# Patient Record
Sex: Male | Born: 1955 | Race: White | Hispanic: No | Marital: Married | Smoking: Former smoker
Health system: Southern US, Community
[De-identification: ages and names within clinical notes are randomized; demographics above are authoritative.]

## PROBLEM LIST (undated history)

## (undated) DIAGNOSIS — K429 Umbilical hernia without obstruction or gangrene: Secondary | ICD-10-CM

## (undated) DIAGNOSIS — R7303 Prediabetes: Secondary | ICD-10-CM

## (undated) HISTORY — DX: Umbilical hernia without obstruction or gangrene: K42.9

## (undated) HISTORY — DX: Prediabetes: R73.03

---

## 2005-10-23 HISTORY — PX: LIVER BIOPSY: SHX301

## 2008-10-23 DIAGNOSIS — I10 Essential (primary) hypertension: Secondary | ICD-10-CM

## 2008-10-23 HISTORY — DX: Essential (primary) hypertension: I10

## 2012-12-24 ENCOUNTER — Encounter (HOSPITAL_COMMUNITY): Payer: Self-pay | Admitting: Emergency Medicine

## 2012-12-24 ENCOUNTER — Emergency Department (HOSPITAL_COMMUNITY)
Admission: EM | Admit: 2012-12-24 | Discharge: 2012-12-25 | Disposition: A | Discharge status: Home / Self Care | Payer: Self-pay | Attending: Emergency Medicine | Admitting: Emergency Medicine

## 2012-12-24 ENCOUNTER — Emergency Department (HOSPITAL_COMMUNITY): Payer: Self-pay

## 2012-12-24 DIAGNOSIS — S0990XA Unspecified injury of head, initial encounter: Secondary | ICD-10-CM | POA: Insufficient documentation

## 2012-12-24 DIAGNOSIS — F1010 Alcohol abuse, uncomplicated: Secondary | ICD-10-CM | POA: Insufficient documentation

## 2012-12-24 DIAGNOSIS — S02609A Fracture of mandible, unspecified, initial encounter for closed fracture: Secondary | ICD-10-CM

## 2012-12-24 DIAGNOSIS — Z79899 Other long term (current) drug therapy: Secondary | ICD-10-CM | POA: Insufficient documentation

## 2012-12-24 DIAGNOSIS — S02610A Fracture of condylar process of mandible, unspecified side, initial encounter for closed fracture: Secondary | ICD-10-CM | POA: Insufficient documentation

## 2012-12-24 MED ORDER — SODIUM CHLORIDE 0.9 % IV SOLN
Freq: Once | INTRAVENOUS | Status: AC
  Administered 2012-12-24: 23:00:00 via INTRAVENOUS
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
## 2012-12-24 NOTE — ED Notes (Signed)
Shari, PA at bedside. 
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
## 2012-12-25 LAB — CBC
MCV: 88.3 fL (ref 78.0–100.0)
Platelets: 266 10*3/uL (ref 150–400)
RBC: 4.37 MIL/uL (ref 4.22–5.81)
WBC: 16.9 10*3/uL — ABNORMAL HIGH (ref 4.0–10.5)

## 2012-12-25 LAB — BASIC METABOLIC PANEL
CO2: 23 mEq/L (ref 19–32)
Chloride: 98 mEq/L (ref 96–112)
Sodium: 133 mEq/L — ABNORMAL LOW (ref 135–145)

## 2012-12-25 LAB — ETHANOL: Alcohol, Ethyl (B): 187 mg/dL — ABNORMAL HIGH (ref 0–11)

## 2012-12-25 MED ORDER — HYDROCODONE-ACETAMINOPHEN 7.5-500 MG/15ML PO SOLN: 15.0000 mL | Freq: Four times a day (QID) | ORAL | Status: DC | PRN

## 2012-12-25 MED ORDER — SODIUM CHLORIDE 0.9 % IV SOLN
1000.0000 mL | INTRAVENOUS | Status: DC
  Administered 2012-12-25: 1000 mL via INTRAVENOUS

## 2012-12-25 MED ORDER — ONDANSETRON HCL 4 MG/2ML IJ SOLN
INTRAMUSCULAR | Status: AC
  Administered 2012-12-25: 4 mg
  Filled 2012-12-25: qty 2

## 2012-12-25 MED ORDER — HYDROMORPHONE HCL PF 1 MG/ML IJ SOLN
1.0000 mg | INTRAMUSCULAR | Status: DC | PRN
  Administered 2012-12-25 (×2): 1 mg via INTRAVENOUS
  Filled 2012-12-25 (×2): qty 1

## 2012-12-25 MED ORDER — MORPHINE SULFATE 4 MG/ML IJ SOLN
4.0000 mg | Freq: Once | INTRAMUSCULAR | Status: AC
  Administered 2012-12-25: 4 mg via INTRAVENOUS
  Filled 2012-12-25: qty 1

## 2012-12-25 MED ORDER — SODIUM CHLORIDE 0.9 % IV SOLN
1000.0000 mL | Freq: Once | INTRAVENOUS | Status: AC
  Administered 2012-12-25: 1000 mL via INTRAVENOUS
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
## 2012-12-25 NOTE — ED Notes (Signed)
Dr. Denton Lank made aware of ENT notes.
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
## 2012-12-25 NOTE — ED Notes (Signed)
ENT at the bedside
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
## 2015-10-24 DIAGNOSIS — R7303 Prediabetes: Secondary | ICD-10-CM

## 2015-10-24 HISTORY — DX: Prediabetes: R73.03

## 2016-10-23 DIAGNOSIS — Z8619 Personal history of other infectious and parasitic diseases: Secondary | ICD-10-CM

## 2016-10-23 HISTORY — DX: Personal history of other infectious and parasitic diseases: Z86.19

## 2018-12-02 ENCOUNTER — Encounter: Payer: Self-pay | Admitting: Internal Medicine

## 2018-12-02 ENCOUNTER — Ambulatory Visit: Payer: Self-pay | Admitting: Internal Medicine

## 2018-12-02 VITALS — BP 152/92 | HR 74 | Resp 12 | Ht 72.0 in | Wt 207.0 lb

## 2018-12-02 DIAGNOSIS — I10 Essential (primary) hypertension: Secondary | ICD-10-CM | POA: Insufficient documentation

## 2018-12-02 DIAGNOSIS — H18413 Arcus senilis, bilateral: Secondary | ICD-10-CM

## 2018-12-02 DIAGNOSIS — Z79899 Other long term (current) drug therapy: Secondary | ICD-10-CM

## 2018-12-02 DIAGNOSIS — K429 Umbilical hernia without obstruction or gangrene: Secondary | ICD-10-CM | POA: Insufficient documentation

## 2018-12-02 DIAGNOSIS — R7303 Prediabetes: Secondary | ICD-10-CM

## 2018-12-02 HISTORY — DX: Umbilical hernia without obstruction or gangrene: K42.9

## 2018-12-02 MED ORDER — AMLODIPINE BESYLATE 5 MG PO TABS: 5.0000 mg | ORAL_TABLET | Freq: Every day | ORAL | 11 refills | Status: DC

## 2018-12-02 MED ORDER — METOPROLOL TARTRATE 25 MG PO TABS: 25.0000 mg | ORAL_TABLET | Freq: Two times a day (BID) | ORAL | 11 refills | Status: DC

## 2018-12-02 NOTE — Progress Notes (Signed)
Subjective:    Patient ID: Carl Wright, male   DOB: [birth year], 63 y.o.   MRN: [id number]   HPI   Here to establish  1.  Essential Hypertension:  Diagnosed about 10 years ago.  Ran out of Amlodipine 5 mg and Metoprolol 25 mg twice daily about 2 weeks ago.   States bp generally runs in the 130/80 range on medication.    2.  Stress due to financial concerns.  Out of prison now for 1 month after 5 year sentence.  Wife held by [employer] for past 5 years and trying to get her out as well.  3.  Prediabetes:  Was 25-30 lb heavier when diagnosed.    4.  Umbilical hernia:  Does not cause him any problems currently        Current Meds  Medication Sig  . amLODipine (NORVASC) 5 MG tablet Take 5 mg by mouth daily.  . metoprolol tartrate (LOPRESSOR) 25 MG tablet Take 25 mg by mouth 2 (two) times daily.   No Known Allergies  Past Medical History:  Diagnosis Date  . Prediabetes 2017   weighed 25 lbs more then   History reviewed. No pertinent surgical history.   Family History  Problem Relation Age of Onset  . Pneumonia Mother   . Heart disease Father 6       MI was cause of death  . Stroke Father   . Hypertension Father   . Diabetes Father   . COPD Sister   . Hypertension Sister   . Diabetes Sister     Social History   Socioeconomic History  . Marital status: Married    Spouse name: Not on file  . Number of children: 1  . Years of education: 56  . Highest education level: High school graduate  Occupational History  . Occupation: unemployed    Comment: Previously had his own [employer]  . Financial resource strain: Very hard  . Food insecurity:    Worry: Never true    Inability: Never true  . Transportation needs:    Medical: Yes    Non-medical: Yes  Tobacco Use  . Smoking status: Never Smoker  . Smokeless tobacco: Never Used  Substance and Sexual Activity  . Alcohol use: Not Currently    Comment: History of 3 DUIs  . Drug use: Never  .  Sexual activity: Not on file  Lifestyle  . Physical activity:    Days per week: 7 days    Minutes per session: 30 min  . Stress: Very much  Relationships  . Social connections:    Talks on phone: Not on file    Gets together: Not on file    Attends religious service: Not on file    Active member of club or organization: Not on file    Attends meetings of clubs or organizations: Not on file    Relationship status: Not on file  . Intimate partner violence:    Fear of current or ex partner: Not on file    Emotionally abused: Not on file    Physically abused: Not on file    Forced sexual activity: Not on file  Other Topics Concern  . Not on file  Social History Narrative   Recently released from prison for food stamp fraud--5 year sentence for which he was released Jan 2020.    His wife is originally [place] and caught up in same situation.  ICE has detained her for  the past 5 years.  He thought she was to be released to him when he got out of prison--so he has this stress as well.   Her green card expired 2 weeks before she was arrested.   He is living with his brother and brother's wife.       Review of Systems    Objective:   BP (!) 152/92 (BP Location: Left Arm, Patient Position: Sitting, Cuff Size: Normal)   Pulse 74   Resp 12   Ht 6' (1.829 m)   Wt 207 lb (93.9 kg)   BMI 28.07 kg/m   Physical Exam  NAD HEENT:  PERRl, EOMI, bilateral arcus senilis.  Discs sharp bilaterally TMs pearly gray, throat without injection Neck:  Supple, No adenopathy, no thyromegaly Chest:  CTA CV:  RRR with normal S1 and S2, No S3, S4 or murmur.  Radial and DP pulses normal and equal Abd:  S, NT, No HSM or mass, + BS.  Opening at umbilicus through muscle wall, but no hernia sac currently LE:  No edema  Assessment & Plan   1.  Hypertension:  Restart Amlodipine 5 mg daily and Metoprolol 25 mg twice daily.   BP and pulse check in 2 weeks. CMP  2.  Prediabetes:  A1C and CMP  today  3.  Arcus senilis:  LP, nonfasting today.  4.  Stress:   Not interested in SW referral.  Has obtained food stamps and working on getting a job.

## 2018-12-03 LAB — COMPREHENSIVE METABOLIC PANEL
ALBUMIN: 4.9 g/dL (ref 3.8–4.9)
ALT: 19 IU/L (ref 0–44)
AST: 19 IU/L (ref 0–40)
Albumin/Globulin Ratio: 1.5 (ref 1.2–2.2)
Alkaline Phosphatase: 80 IU/L (ref 39–117)
BUN / CREAT RATIO: 14 (ref 10–24)
BUN: 14 mg/dL (ref 8–27)
Bilirubin Total: 0.3 mg/dL (ref 0.0–1.2)
CALCIUM: 10.4 mg/dL — AB (ref 8.6–10.2)
CO2: 24 mmol/L (ref 20–29)
CREATININE: 0.97 mg/dL (ref 0.76–1.27)
Chloride: 101 mmol/L (ref 96–106)
GFR, EST AFRICAN AMERICAN: 98 mL/min/{1.73_m2} (ref >59)
GFR, EST NON AFRICAN AMERICAN: 84 mL/min/{1.73_m2} (ref >59)
GLOBULIN, TOTAL: 3.3 g/dL (ref 1.5–4.5)
Glucose: 95 mg/dL (ref 65–99)
Potassium: 5.1 mmol/L (ref 3.5–5.2)
SODIUM: 140 mmol/L (ref 134–144)
TOTAL PROTEIN: 8.2 g/dL (ref 6.0–8.5)

## 2018-12-03 LAB — HGB A1C W/O EAG: Hgb A1c MFr Bld: 5.7 % — ABNORMAL HIGH (ref 4.8–5.6)

## 2018-12-03 LAB — LIPID PANEL W/O CHOL/HDL RATIO
CHOLESTEROL TOTAL: 155 mg/dL (ref 100–199)
HDL: 55 mg/dL (ref >39)
LDL CALC: 85 mg/dL (ref 0–99)
Triglycerides: 76 mg/dL (ref 0–149)
VLDL CHOLESTEROL CAL: 15 mg/dL (ref 5–40)

## 2018-12-04 ENCOUNTER — Telehealth: Payer: Self-pay | Admitting: Internal Medicine

## 2018-12-04 NOTE — Telephone Encounter (Signed)
Social Work Intern Astronomer called Carl Wright to welcome him to [facility] and to inform him about mental health counseling that is also available if interested.

## 2018-12-12 ENCOUNTER — Ambulatory Visit: Payer: Self-pay

## 2018-12-12 VITALS — BP 138/88 | HR 70

## 2018-12-12 DIAGNOSIS — I10 Essential (primary) hypertension: Secondary | ICD-10-CM

## 2018-12-12 MED ORDER — METOPROLOL TARTRATE 25 MG PO TABS: 25.0000 mg | ORAL_TABLET | Freq: Two times a day (BID) | ORAL | 3 refills | Status: DC

## 2018-12-12 MED ORDER — AMLODIPINE BESYLATE 5 MG PO TABS: 5.0000 mg | ORAL_TABLET | Freq: Every day | ORAL | 3 refills | Status: DC

## 2018-12-12 NOTE — Progress Notes (Signed)
Patient BP better then when he came in for initial visit. Patient informed to continue current dose of medication. Patient has lab appointment scheduled for 2 months. Will recheck BP at that time. Patient verbalized understanding

## 2019-02-12 ENCOUNTER — Other Ambulatory Visit: Payer: Self-pay

## 2019-03-19 ENCOUNTER — Other Ambulatory Visit: Payer: Self-pay

## 2019-04-08 ENCOUNTER — Ambulatory Visit: Payer: Self-pay | Admitting: Internal Medicine

## 2019-12-07 ENCOUNTER — Other Ambulatory Visit: Payer: Self-pay | Admitting: Internal Medicine

## 2019-12-08 ENCOUNTER — Other Ambulatory Visit: Payer: Self-pay | Admitting: Internal Medicine

## 2019-12-17 ENCOUNTER — Encounter: Payer: Self-pay | Admitting: Internal Medicine

## 2019-12-17 ENCOUNTER — Other Ambulatory Visit: Payer: Self-pay

## 2019-12-17 ENCOUNTER — Ambulatory Visit: Payer: Self-pay | Admitting: Internal Medicine

## 2019-12-17 VITALS — BP 168/90 | HR 76 | Temp 99.8°F | Resp 14 | Ht 72.0 in | Wt 212.0 lb

## 2019-12-17 DIAGNOSIS — N529 Male erectile dysfunction, unspecified: Secondary | ICD-10-CM

## 2019-12-17 DIAGNOSIS — G609 Hereditary and idiopathic neuropathy, unspecified: Secondary | ICD-10-CM

## 2019-12-17 DIAGNOSIS — I10 Essential (primary) hypertension: Secondary | ICD-10-CM

## 2019-12-17 DIAGNOSIS — R7303 Prediabetes: Secondary | ICD-10-CM

## 2019-12-17 MED ORDER — AMLODIPINE BESYLATE 10 MG PO TABS: ORAL_TABLET | ORAL | 3 refills | Status: DC

## 2019-12-17 NOTE — Patient Instructions (Signed)
Drink a glass of water before every meal Drink 6-8 glasses of water daily Eat three meals daily Eat a protein and healthy fat with every meal (eggs,fish, chicken, [place] and limit red meats) Eat 5 servings of vegetables daily, mix the colors Eat 2 servings of fruit daily with skin, if skin is edible Use smaller plates Put food/utensils down as you chew and swallow each bite Eat at a table with friends/family at least once daily, no TV Do not eat in front of the TV  Recent studies show that people who consume all of their calories in a 12 hour period lose weight more efficiently.  For example, if you eat your first meal at 7:00 a.m., your last meal of the day should be completed by 7:00 p.m.  Your first goal is to give up all sweet drinks for 14 days--and then continue to do so.  Wean Metoprolol as written on med list.

## 2019-12-17 NOTE — Progress Notes (Signed)
    Subjective:    Patient ID: Carl Wright, male   DOB: 1956/02/10, 64 y.o.   MRN: [id number]   HPI  Has not been seen in over 1 year.   1.  Essential Hypertension:  Never misses his amlodipine or metoprolol.    2.  Erectile dysfunction:  Erection only lasts about 3 minutes and not able to reach orgasm.  When he self stimulates, his erection is better, but still short lived and able to reach orgasm.  His wife has been with him since December after being in [employer] custody for 6 years.  He has been on Lisinopril in the past with side effects, including what he feels was ED due to the medication.  When off all meds, no ED.  His BP when incarcerated in 2015 was too high and so was placed on amlodipine, Metoprolol, HCTZ.  He took himself off HCTZ as was running to the bathroom all the time.  Actually stopped all 3 meds.  Back on Amlodipine and Metoprolol thereafter and ED returned.   He does have some stress.  3.  Burning in feet:  Started when in prison 2 years ago.  States from wearing bad shoes on hard floors.   No polydipsia or polyuria. 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 No symptoms anywhere else.  No low back pain.   Father had diabetes.  His mother was obese and he is not aware of what health issues she may have had--she died at 42.  Father died at 50 of an MI.  Current Meds  Medication Sig  . amLODipine (NORVASC) 5 MG tablet Take 1 tablet by mouth once daily  . metoprolol tartrate (LOPRESSOR) 25 MG tablet Take 1 tablet by mouth twice daily   Allergies  Allergen Reactions  . Lisinopril     States caused gynecomastia in left breast area.     Review of Systems    Objective:   BP (!) 168/90 (BP Location: Right Arm, Patient Position: Sitting, Cuff Size: Normal)   Pulse 76   Temp 99.8 F (37.7 C)   Resp 14   Ht 6' (1.829 m)   Wt 212 lb (96.2 kg)   BMI 28.75 kg/m   Physical Exam  NAD HEENT:   PERRL, EOMI Neck:  Supple, No adenopathy, no thyromegaly Chest:  CTA CV:  RRR with normal S1 and S2, No S3, S4 or murmur.  No carotid bruits.  Carotid, radial and DP pulses normal and equal Abd:  S, NT, No HSM or mass, + BS LE:  No edema.  Normal sensation with 10 g monofilament throughout bilateral feet.   Assessment & Plan   1.  Hypertension:  Patient feels combination of Amlodipine and Metoprolol are causing ED.  Wean off Metoprolol (wrote on discharge papers) and increase Amlodipine to 10 mg daily.  Nurse visit for bp and pulse check in 2 weeks and for fasting labs--FLP.  2.  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  Prediabetes:  A1C with upcoming labs.  Discussed diet and physical activity.  4.  ED:  Control risk factors as above.  Will make changes to bp control meds and see if helps.

## 2019-12-31 ENCOUNTER — Other Ambulatory Visit: Payer: Self-pay

## 2019-12-31 ENCOUNTER — Other Ambulatory Visit (INDEPENDENT_AMBULATORY_CARE_PROVIDER_SITE_OTHER): Payer: Self-pay

## 2019-12-31 VITALS — BP 166/99 | HR 97

## 2019-12-31 DIAGNOSIS — I10 Essential (primary) hypertension: Secondary | ICD-10-CM

## 2019-12-31 DIAGNOSIS — R5383 Other fatigue: Secondary | ICD-10-CM

## 2019-12-31 DIAGNOSIS — R7303 Prediabetes: Secondary | ICD-10-CM

## 2019-12-31 DIAGNOSIS — D649 Anemia, unspecified: Secondary | ICD-10-CM

## 2019-12-31 DIAGNOSIS — Z79899 Other long term (current) drug therapy: Secondary | ICD-10-CM

## 2019-12-31 DIAGNOSIS — Z13220 Encounter for screening for lipoid disorders: Secondary | ICD-10-CM

## 2019-12-31 MED ORDER — TRIAMTERENE-HCTZ 37.5-25 MG PO CAPS: 1.0000 | ORAL_CAPSULE | Freq: Every day | ORAL | 11 refills | Status: DC

## 2019-12-31 NOTE — Progress Notes (Signed)
Patient BP still running after stopping the Metoprolol and only taking amlodipine per Dr. Amil Amen add dyazide and have patient come back in 2 weeks for BP and BMP. Patient verbalized understanding and Rx was sent to [facility] in [place].

## 2020-01-01 LAB — COMPREHENSIVE METABOLIC PANEL
ALT: 25 IU/L (ref 0–44)
AST: 26 IU/L (ref 0–40)
Albumin/Globulin Ratio: 1.4 (ref 1.2–2.2)
Albumin: 4.7 g/dL (ref 3.8–4.8)
Alkaline Phosphatase: 85 IU/L (ref 39–117)
BUN/Creatinine Ratio: 12 (ref 10–24)
BUN: 13 mg/dL (ref 8–27)
Bilirubin Total: 0.6 mg/dL (ref 0.0–1.2)
CO2: 23 mmol/L (ref 20–29)
Calcium: 9.7 mg/dL (ref 8.6–10.2)
Chloride: 101 mmol/L (ref 96–106)
Creatinine, Ser: 1.13 mg/dL (ref 0.76–1.27)
GFR calc Af Amer: 81 mL/min/{1.73_m2} (ref >59)
GFR calc non Af Amer: 70 mL/min/{1.73_m2} (ref >59)
Globulin, Total: 3.4 g/dL (ref 1.5–4.5)
Glucose: 96 mg/dL (ref 65–99)
Potassium: 4.8 mmol/L (ref 3.5–5.2)
Sodium: 138 mmol/L (ref 134–144)
Total Protein: 8.1 g/dL (ref 6.0–8.5)

## 2020-01-01 LAB — HGB A1C W/O EAG: Hgb A1c MFr Bld: 5.8 % — ABNORMAL HIGH (ref 4.8–5.6)

## 2020-01-01 LAB — LIPID PANEL W/O CHOL/HDL RATIO
Cholesterol, Total: 146 mg/dL (ref 100–199)
HDL: 53 mg/dL (ref >39)
LDL Chol Calc (NIH): 81 mg/dL (ref 0–99)
Triglycerides: 55 mg/dL (ref 0–149)
VLDL Cholesterol Cal: 12 mg/dL (ref 5–40)

## 2020-01-01 LAB — TSH: TSH: 2.2 u[IU]/mL (ref 0.450–4.500)

## 2020-01-01 LAB — FOLATE: Folate: 13.5 ng/mL (ref >3.0)

## 2020-01-01 LAB — VITAMIN B12: Vitamin B-12: 656 pg/mL (ref 232–1245)

## 2020-01-14 ENCOUNTER — Other Ambulatory Visit: Payer: Self-pay

## 2020-01-14 ENCOUNTER — Other Ambulatory Visit (INDEPENDENT_AMBULATORY_CARE_PROVIDER_SITE_OTHER): Payer: Self-pay

## 2020-01-14 VITALS — BP 144/90 | HR 92

## 2020-01-14 DIAGNOSIS — G609 Hereditary and idiopathic neuropathy, unspecified: Secondary | ICD-10-CM | POA: Insufficient documentation

## 2020-01-14 DIAGNOSIS — I10 Essential (primary) hypertension: Secondary | ICD-10-CM

## 2020-01-14 DIAGNOSIS — N529 Male erectile dysfunction, unspecified: Secondary | ICD-10-CM | POA: Insufficient documentation

## 2020-01-14 NOTE — Progress Notes (Signed)
Patient came in for BP check and blood work. Patient BP still running high. Patient gave a history of him taking 3 BP medications. After getting clarification from Dr. Amil Amen patient is to be on Amlodipine 1 daily and Dyazide 1 daily. Patient should be taking his BP medication in the morning.   Spoke with patient. Informed of how he needs to take medication. Patient verbalized understanding and per Dr. Amil Amen have patient to follow up in 1 month for BP and pulse check. Patient verbalized understanding and appointment scheduled.

## 2020-01-15 LAB — BASIC METABOLIC PANEL
BUN/Creatinine Ratio: 19 (ref 10–24)
BUN: 25 mg/dL (ref 8–27)
CO2: 25 mmol/L (ref 20–29)
Calcium: 10.5 mg/dL — ABNORMAL HIGH (ref 8.6–10.2)
Chloride: 95 mmol/L — ABNORMAL LOW (ref 96–106)
Creatinine, Ser: 1.29 mg/dL — ABNORMAL HIGH (ref 0.76–1.27)
GFR calc Af Amer: 69 mL/min/{1.73_m2} (ref >59)
GFR calc non Af Amer: 59 mL/min/{1.73_m2} — ABNORMAL LOW (ref >59)
Glucose: 107 mg/dL — ABNORMAL HIGH (ref 65–99)
Potassium: 4.7 mmol/L (ref 3.5–5.2)
Sodium: 138 mmol/L (ref 134–144)

## 2020-03-15 ENCOUNTER — Other Ambulatory Visit: Payer: Self-pay

## 2020-03-15 ENCOUNTER — Ambulatory Visit: Payer: Self-pay | Admitting: Internal Medicine

## 2020-03-15 ENCOUNTER — Encounter: Payer: Self-pay | Admitting: Internal Medicine

## 2020-03-15 VITALS — BP 138/82 | HR 98 | Resp 12 | Ht 72.0 in | Wt 201.0 lb

## 2020-03-15 DIAGNOSIS — R7303 Prediabetes: Secondary | ICD-10-CM

## 2020-03-15 DIAGNOSIS — G609 Hereditary and idiopathic neuropathy, unspecified: Secondary | ICD-10-CM

## 2020-03-15 DIAGNOSIS — I10 Essential (primary) hypertension: Secondary | ICD-10-CM

## 2020-03-15 DIAGNOSIS — N529 Male erectile dysfunction, unspecified: Secondary | ICD-10-CM

## 2020-03-15 MED ORDER — TRIAMTERENE-HCTZ 37.5-25 MG PO CAPS: 1.0000 | ORAL_CAPSULE | Freq: Every day | ORAL | 3 refills | Status: DC

## 2020-03-15 NOTE — Progress Notes (Signed)
    Subjective:    Patient ID: Carl Wright, male   DOB: 10/18/1956, 64 y.o.   MRN: [id number]   HPI   1.  Hypertension:  BP now at better level and weight down as well.  He is eating better.  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    2.  ED:  A bit better since stopping Metoprolol.    3.  Prediabetes:  He does feel he has lost most of weight since his A1C of 5.8% in March.    4.  COVID vaccination:  Received first dose today.  Moderna.  5.  Burning in feet:  Better since stopping sodas.  Bothers him most after he has been on feet for long period of time.  States he was given too small steel toed boots when in prison and feels this was the cause for his foot pain.  Does not have an orange card.  TSH, CBC, CMP, 123456 and folic acid all okay last visit.  Current Meds  Medication Sig  . amLODipine (NORVASC) 10 MG tablet 1 tab by mouth daily.  Marland Kitchen triamterene-hydrochlorothiazide (DYAZIDE) 37.5-25 MG capsule Take 1 each (1 capsule total) by mouth daily.   Allergies  Allergen Reactions  . Lisinopril     States caused gynecomastia in left breast area.     Review of Systems    Objective:   BP 138/82 (BP Location: Left Arm, Patient Position: Sitting, Cuff Size: Normal)   Pulse 98   Resp 12   Ht 6' (1.829 m)   Wt 201 lb (91.2 kg)   BMI 27.26 kg/m   Physical Exam  NAD Lungs:  CTA CV:  RRR without murmur or rub.  Radial and DP pulses normal and equal Abd:  S, NT, No HSM or mass, + BER LE:  No edema.   Assessment & Plan   1.  Hypertension:  Fair control.  2.  ED:  Improved after discontinuation of Metoprolol  3.  COVID vaccine, Moderna.  Next vaccine on 04/12/20.  4.  Prediabetes:  Weight down.  Continue working on lifestyle changes.  5.  Foot complaints.  Hold on starting gabapentin.  Needs orange card for podiatry referral.  No sure if shoe inserts could help.

## 2020-06-15 ENCOUNTER — Other Ambulatory Visit: Payer: Self-pay

## 2020-06-15 DIAGNOSIS — Z7251 High risk heterosexual behavior: Secondary | ICD-10-CM

## 2020-06-15 DIAGNOSIS — R768 Other specified abnormal immunological findings in serum: Secondary | ICD-10-CM

## 2020-06-15 DIAGNOSIS — Z79899 Other long term (current) drug therapy: Secondary | ICD-10-CM

## 2020-06-15 DIAGNOSIS — Z13220 Encounter for screening for lipoid disorders: Secondary | ICD-10-CM

## 2020-06-15 DIAGNOSIS — Z125 Encounter for screening for malignant neoplasm of prostate: Secondary | ICD-10-CM

## 2020-06-15 DIAGNOSIS — R7303 Prediabetes: Secondary | ICD-10-CM

## 2020-06-16 LAB — COMPREHENSIVE METABOLIC PANEL
ALT: 19 IU/L (ref 0–44)
AST: 21 IU/L (ref 0–40)
Albumin/Globulin Ratio: 1.4 (ref 1.2–2.2)
Albumin: 4.7 g/dL (ref 3.8–4.8)
Alkaline Phosphatase: 83 IU/L (ref 48–121)
BUN/Creatinine Ratio: 12 (ref 10–24)
BUN: 14 mg/dL (ref 8–27)
Bilirubin Total: 0.4 mg/dL (ref 0.0–1.2)
CO2: 27 mmol/L (ref 20–29)
Calcium: 10.2 mg/dL (ref 8.6–10.2)
Chloride: 98 mmol/L (ref 96–106)
Creatinine, Ser: 1.18 mg/dL (ref 0.76–1.27)
GFR calc Af Amer: 77 mL/min/{1.73_m2} (ref >59)
GFR calc non Af Amer: 66 mL/min/{1.73_m2} (ref >59)
Globulin, Total: 3.3 g/dL (ref 1.5–4.5)
Glucose: 97 mg/dL (ref 65–99)
Potassium: 4.5 mmol/L (ref 3.5–5.2)
Sodium: 137 mmol/L (ref 134–144)
Total Protein: 8 g/dL (ref 6.0–8.5)

## 2020-06-16 LAB — CBC WITH DIFFERENTIAL/PLATELET
Basophils Absolute: 0.1 10*3/uL (ref 0.0–0.2)
Basos: 1 %
EOS (ABSOLUTE): 0.1 10*3/uL (ref 0.0–0.4)
Eos: 1 %
Hematocrit: 47.2 % (ref 37.5–51.0)
Hemoglobin: 15.5 g/dL (ref 13.0–17.7)
Immature Grans (Abs): 0 10*3/uL (ref 0.0–0.1)
Immature Granulocytes: 0 %
Lymphocytes Absolute: 1.8 10*3/uL (ref 0.7–3.1)
Lymphs: 22 %
MCH: 28.5 pg (ref 26.6–33.0)
MCHC: 32.8 g/dL (ref 31.5–35.7)
MCV: 87 fL (ref 79–97)
Monocytes Absolute: 0.7 10*3/uL (ref 0.1–0.9)
Monocytes: 9 %
Neutrophils Absolute: 5.5 10*3/uL (ref 1.4–7.0)
Neutrophils: 67 %
Platelets: 256 10*3/uL (ref 150–450)
RBC: 5.44 x10E6/uL (ref 4.14–5.80)
RDW: 13.2 % (ref 11.6–15.4)
WBC: 8.1 10*3/uL (ref 3.4–10.8)

## 2020-06-16 LAB — HEPATITIS C ANTIBODY: Hep C Virus Ab: >11 s/co ratio — ABNORMAL HIGH (ref 0.0–0.9)

## 2020-06-16 LAB — LIPID PANEL W/O CHOL/HDL RATIO
Cholesterol, Total: 155 mg/dL (ref 100–199)
HDL: 53 mg/dL (ref >39)
LDL Chol Calc (NIH): 91 mg/dL (ref 0–99)
Triglycerides: 53 mg/dL (ref 0–149)
VLDL Cholesterol Cal: 11 mg/dL (ref 5–40)

## 2020-06-16 LAB — PSA: Prostate Specific Ag, Serum: 1 ng/mL (ref 0.0–4.0)

## 2020-06-16 LAB — HIV ANTIBODY (ROUTINE TESTING W REFLEX): HIV Screen 4th Generation wRfx: NONREACTIVE

## 2020-06-16 LAB — HGB A1C W/O EAG: Hgb A1c MFr Bld: 6.1 % — ABNORMAL HIGH (ref 4.8–5.6)

## 2020-06-23 ENCOUNTER — Encounter: Payer: Self-pay | Admitting: Internal Medicine

## 2020-07-12 NOTE — Addendum Note (Signed)
Addended by: Marcelino Duster on: 07/12/2020 03:37 PM   Modules accepted: Orders

## 2020-07-21 ENCOUNTER — Telehealth: Payer: Self-pay | Admitting: Internal Medicine

## 2020-07-21 NOTE — Telephone Encounter (Signed)
Pt. Called back after receiving letter from [facility] to contact office- patient informed of  Lab results and positive result of Hep C screening. Pt. States he was treated for this back in 2018 and took a pill daily for 84 days and was tested two months after and was clear. Patient wants to know why he needs to go back to Hep C clinic.  Please advise

## 2020-07-22 ENCOUNTER — Encounter: Payer: Self-pay | Admitting: Internal Medicine

## 2020-07-22 NOTE — Telephone Encounter (Signed)
Discussed with Dr. Amil Amen - Dr. Amil Amen was not aware of patient's treatment back in 2018.  Treatment has been added to history and patient does not need to go Hep C clinic.

## 2020-08-05 ENCOUNTER — Ambulatory Visit: Payer: Self-pay | Admitting: Internal Medicine

## 2020-08-05 ENCOUNTER — Encounter: Payer: Self-pay | Admitting: Internal Medicine

## 2020-08-05 VITALS — BP 142/92 | HR 92 | Resp 12 | Ht 72.0 in | Wt 193.0 lb

## 2020-08-05 DIAGNOSIS — R7303 Prediabetes: Secondary | ICD-10-CM

## 2020-08-05 DIAGNOSIS — R5383 Other fatigue: Secondary | ICD-10-CM

## 2020-08-05 DIAGNOSIS — N529 Male erectile dysfunction, unspecified: Secondary | ICD-10-CM

## 2020-08-05 DIAGNOSIS — Z0000 Encounter for general adult medical examination without abnormal findings: Secondary | ICD-10-CM

## 2020-08-05 DIAGNOSIS — I10 Essential (primary) hypertension: Secondary | ICD-10-CM

## 2020-08-05 DIAGNOSIS — G609 Hereditary and idiopathic neuropathy, unspecified: Secondary | ICD-10-CM

## 2020-08-05 NOTE — Progress Notes (Signed)
Subjective:    Patient ID: Carl Wright, male   DOB: 19-Nov-1955, 64 y.o.   MRN: [id number]   HPI   Here for Male CPE:  1.  STE:  Does not perform.  No family history of testicular cancer.   2.  PSA: PSA in August was normal at 1.0.  No family history of prostate cancer  3.  Guaiac Cards:  Never.   4.  Colonoscopy:  Never.  No family history of colon cancer.    5.  Cholesterol/Glucose:  Cholesterol panel has been fine in past 2 years.  Prediabetes with A1C at 6.1% in August.  Admits to eating lots of sugary foods at times.   Difficulty getting history with his eating habits.   6.  Immunizations: Refuses influenza vaccine.  Immunization History  Administered Date(s) Administered  . Moderna SARS-COVID-2 Vaccination 03/15/2020, 04/12/2020  . Tdap 10/23/2013     Current Meds  Medication Sig  . amLODipine (NORVASC) 10 MG tablet 1 tab by mouth daily.  Marland Kitchen triamterene-hydrochlorothiazide (DYAZIDE) 37.5-25 MG capsule Take 1 each (1 capsule total) by mouth daily.   Allergies  Allergen Reactions  . Lisinopril     States caused gynecomastia in left breast area.   Past Medical History:  Diagnosis Date  . History of hepatitis C 2018   Treated with oral therapy for 84 days and viral titre reportedly negative thereafter.  In prison  . Hypertension 2010  . Prediabetes 2017   weighed 25 lbs more then  . Umbilical hernia 4/62/7035    History reviewed. No pertinent surgical history.  Family History  Problem Relation Age of Onset  . Pneumonia Mother   . Heart disease Father 90       MI was cause of death  . Stroke Father   . Hypertension Father   . Diabetes Father   . COPD Sister   . Hypertension Sister   . Diabetes Sister    Social History   Socioeconomic History  . Marital status: Married    Spouse name: Atari Novick  . Number of children: 1  . Years of education: 61  . Highest education level: High school graduate  Occupational History  . 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 Smoking status: Never Smoker  . Smokeless tobacco: Never Used  Vaping Use  . Vaping Use: Never used  Substance and Sexual Activity  . Alcohol use: Not Currently    Comment: History of 3 DUIs; last was 2015.    . Drug use: Not Currently    Comment: Hx in 1980s.  . Sexual activity: Yes  Other Topics Concern  . Not on file  Social History Narrative   Recently released from prison for food stamp fraud--5 year sentence for which he was released Jan 2020.    His wife is originally [place] and caught up in same situation.  ICE has detained her for the past 5 years.  He thought she was to be released to him when he got out of prison--so he has this stress as well.   Her green card expired 2 weeks before she was arrested.   He is living with his brother and brother's wife.   3/4 [place] Native American   Also [place] and [place] American      Social Determinants of Health   Financial Resource Strain:   . Difficulty of Paying Living Expenses: Not on [employer]  Insecurity: No Food Insecurity  . Worried About Charity fundraiser in the Last Year: Never true  . Ran Out of Food in the Last Year: Never true  Transportation Needs: No Transportation Needs  . Lack of Transportation (Medical): No  . Lack of Transportation (Non-Medical): No  Physical Activity:   . Days of Exercise per Week: Not on file  . Minutes of Exercise per Session: Not on file  Stress:   . Feeling of Stress : Not on file  Social Connections:   . Frequency of Communication with Friends and Family: Not on file  . Frequency of Social Gatherings with Friends and Family: Not on file  . Attends Religious Services: Not on file  . Active Member of Clubs or Organizations: Not on file  . Attends Archivist Meetings: Not on file  . Marital Status: Not on file  Intimate Partner Violence:   . Fear of Current or Ex-Partner: Not on file  . Emotionally Abused:  Not on file  . Physically Abused: Not on file  . Sexually Abused: Not on file      Review of Systems  Constitutional: Positive for fatigue (Getting low). Negative for appetite change.  HENT: Positive for dental problem (Teeth are going crooked.). Negative for hearing loss, rhinorrhea, sneezing and sore throat.   Eyes: Positive for visual disturbance (Blurry at times.  ).  Respiratory: Negative for cough and shortness of breath.   Cardiovascular: Positive for leg swelling (If stands too long, legs and feet swell.). Negative for chest pain and palpitations.  Gastrointestinal: Positive for constipation (Recent problem.). Negative for abdominal pain, blood in stool (No melena) and diarrhea.  Genitourinary: Negative for decreased urine volume and testicular pain.       ED problems since on BP meds in 2010.  Cannot get an erection long enough to have intercourse.  Often does not reach climax.  Musculoskeletal: Negative for arthralgias.  Skin: Negative for rash.  Neurological: Positive for numbness (Feet with numbness and tingling.  ). Negative for weakness.  Psychiatric/Behavioral: Positive for dysphoric mood (Depressed since lost OHY--0737.  ). Negative for suicidal ideas. The patient is not nervous/anxious.       Objective:   BP (!) 142/92 (BP Location: Left Arm, Patient Position: Sitting, Cuff Size: Normal)   Pulse 92   Resp 12   Ht 6' (1.829 m)   Wt 193 lb (87.5 kg)   BMI 26.18 kg/m   Physical Exam Constitutional:      Appearance: Normal appearance.  HENT:     Head: Normocephalic and atraumatic.     Right Ear: Hearing, tympanic membrane, ear canal and external ear normal.     Left Ear: Hearing, tympanic membrane, ear canal and external ear normal.     Nose: Nose normal.     Mouth/Throat:     Mouth: Mucous membranes are moist.     Pharynx: Oropharynx is clear.  Eyes:     Extraocular Movements: Extraocular movements intact.     Conjunctiva/sclera: Conjunctivae normal.      Pupils: Pupils are equal, round, and reactive to light.  Neck:     Thyroid: No thyroid mass or thyromegaly.  Cardiovascular:     Rate and Rhythm: Normal rate and regular rhythm.     Heart sounds: S1 normal and S2 normal. No murmur heard.  No friction rub. No S3 or S4 sounds.      Comments: No carotid bruits.  Carotid, radial, femoral, DP and PT pulses  normal and equal.  Pulmonary:     Effort: Pulmonary effort is normal.     Breath sounds: Normal breath sounds.  Abdominal:     General: Bowel sounds are normal.     Palpations: Abdomen is soft. There is no hepatomegaly or mass.     Tenderness: There is no abdominal tenderness.     Hernia: No hernia is present. There is no hernia in the left inguinal area or right inguinal area.  Genitourinary:    Penis: Circumcised.      Testes:        Right: Mass or tenderness not present. Right testis is descended.        Left: Mass or tenderness not present. Left testis is descended.     Comments: Testicles very soft and with little volume-almost flat. Musculoskeletal:        General: Normal range of motion.     Cervical back: Normal range of motion and neck supple.  Lymphadenopathy:     Head:     Right side of head: No submental or submandibular adenopathy.     Left side of head: No submental or submandibular adenopathy.     Cervical: No cervical adenopathy.     Upper Body:     Right upper body: No supraclavicular adenopathy.     Left upper body: No supraclavicular adenopathy.     Lower Body: No right inguinal adenopathy. No left inguinal adenopathy.  Skin:    General: Skin is warm.     Capillary Refill: Capillary refill takes less than 2 seconds.  Neurological:     Mental Status: He is alert and oriented to person, place, and time.     Cranial Nerves: Cranial nerves are intact.     Sensory: Sensation is intact.     Motor: Motor function is intact.     Coordination: Coordination is intact.     Gait: Gait is intact.     Deep Tendon  Reflexes: Reflexes are normal and symmetric.  Psychiatric:        Attention and Perception: Attention normal.        Mood and Affect: Affect normal.        Speech: Speech normal.        Behavior: Behavior normal.        Thought Content: Thought content normal.      Assessment & Plan  !.  CPE  Guaiac Cards x 3 to return in 2 weeks.  2.  Prediabetes:  To work on diet and physical activity.  A1C in 2 months with follow up and weight check  3.  Decreased energy/depression/ED issues:  Testicular volume also seems decreased.  Wait for testosterone before consider antidepressant or Viagra.

## 2020-08-05 NOTE — Patient Instructions (Addendum)
Can google "advance directives, [place]"  And bring up form from Secretary of [place]. Print and fill out Or can go to "5 wishes"  Which is also in Spanish and fill out--this costs $5--perhaps easier to use. Designate a Forensic scientist to speak for you if you are unable to speak for yourself when ill or injured  Call for influenza vaccine in 1 week.  Drink a glass of water before every meal Drink 6-8 glasses of water daily Eat three meals daily Eat a protein and healthy fat with every meal (eggs,fish, chicken, [place] and limit red meats) Eat 5 servings of vegetables daily, mix the colors Eat 2 servings of fruit daily with skin, if skin is edible Use smaller plates Put food/utensils down as you chew and swallow each bite Eat at a table with friends/family at least once daily, no TV Do not eat in front of the TV  Recent studies show that people who consume all of their calories in a 12 hour period lose weight more efficiently.  For example, if you eat your first meal at 7:00 a.m., your last meal of the day should be completed by 7:00 p.m.

## 2020-08-06 LAB — TESTOSTERONE: Testosterone: 541 ng/dL (ref 264–916)

## 2020-08-06 NOTE — Telephone Encounter (Signed)
Pt . Does not pick up calls. Pt seen at office yesterday for CPE

## 2020-08-13 ENCOUNTER — Other Ambulatory Visit (INDEPENDENT_AMBULATORY_CARE_PROVIDER_SITE_OTHER): Payer: Self-pay | Admitting: Internal Medicine

## 2020-08-13 DIAGNOSIS — Z1211 Encounter for screening for malignant neoplasm of colon: Secondary | ICD-10-CM

## 2020-08-13 LAB — POC HEMOCCULT BLD/STL (HOME/3-CARD/SCREEN)
Card #2 Fecal Occult Blod, POC: NEGATIVE
Card #3 Fecal Occult Blood, POC: NEGATIVE
Fecal Occult Blood, POC: NEGATIVE

## 2020-08-13 NOTE — Progress Notes (Signed)
Brought in guaiac cards x 3 for testing

## 2020-08-29 MED ORDER — SILDENAFIL CITRATE 50 MG PO TABS: ORAL_TABLET | ORAL | 6 refills | Status: DC

## 2020-08-29 MED ORDER — BUPROPION HCL ER (SR) 150 MG PO TB12: ORAL_TABLET | ORAL | 2 refills | Status: DC

## 2020-08-29 NOTE — Addendum Note (Signed)
Addended by: Marcelino Duster on: 08/29/2020 07:59 PM   Modules accepted: Orders

## 2020-10-05 ENCOUNTER — Other Ambulatory Visit: Payer: Self-pay

## 2020-10-05 ENCOUNTER — Other Ambulatory Visit: Payer: Self-pay | Admitting: Internal Medicine

## 2020-12-07 ENCOUNTER — Ambulatory Visit: Payer: Self-pay | Admitting: Internal Medicine

## 2021-01-01 ENCOUNTER — Other Ambulatory Visit: Payer: Self-pay | Admitting: Internal Medicine

## 2021-01-03 ENCOUNTER — Telehealth: Payer: Self-pay | Admitting: Internal Medicine

## 2021-01-03 NOTE — Telephone Encounter (Signed)
Patient called asking for Rx refill of amLODipine (NORVASC) 10 MG tablet. Patient continued using pharmacy [place], [place]

## 2021-01-03 NOTE — Telephone Encounter (Signed)
Please ask patient to allow time for [place] to receive refill request from pharmacy before calling.  Also, we do not fill during the weekends or holidays and please give 48 hours to have filled. Very important to call to get refills as soon as you know you are on your last fill, not when it is almost gone or is gone.

## 2021-01-05 NOTE — Telephone Encounter (Signed)
Called patient and left a message asking to call back.

## 2021-01-12 ENCOUNTER — Other Ambulatory Visit: Payer: Self-pay | Admitting: Internal Medicine

## 2021-01-12 NOTE — Telephone Encounter (Signed)
Spoke with the patient and confirmed that he had received his Rx, also explained to him the turn around time when requesting his refills. I asked the patient to call at least two week before he runs out of his meds. Patient verbally expressed understanding.

## 2021-01-12 NOTE — Telephone Encounter (Signed)
Called patient and left a message asking to call back to confirm he was able to pick up his prescription and Doctor notes.

## 2021-04-26 ENCOUNTER — Ambulatory Visit: Payer: Self-pay | Admitting: Internal Medicine

## 2021-10-04 ENCOUNTER — Other Ambulatory Visit: Payer: Self-pay | Admitting: Internal Medicine

## 2021-10-06 ENCOUNTER — Other Ambulatory Visit: Payer: Self-pay | Admitting: Internal Medicine

## 2021-11-18 ENCOUNTER — Encounter: Payer: Self-pay | Admitting: Internal Medicine

## 2021-11-18 ENCOUNTER — Other Ambulatory Visit: Payer: Self-pay

## 2021-11-18 ENCOUNTER — Ambulatory Visit (INDEPENDENT_AMBULATORY_CARE_PROVIDER_SITE_OTHER): Payer: Commercial Managed Care - HMO | Admitting: Internal Medicine

## 2021-11-18 VITALS — BP 142/90 | HR 96 | Resp 12 | Ht 72.0 in | Wt 202.0 lb

## 2021-11-18 DIAGNOSIS — I10 Essential (primary) hypertension: Secondary | ICD-10-CM

## 2021-11-18 DIAGNOSIS — Z79899 Other long term (current) drug therapy: Secondary | ICD-10-CM

## 2021-11-18 DIAGNOSIS — R7303 Prediabetes: Secondary | ICD-10-CM | POA: Diagnosis not present

## 2021-11-18 DIAGNOSIS — Z8619 Personal history of other infectious and parasitic diseases: Secondary | ICD-10-CM

## 2021-11-18 MED ORDER — TRIAMTERENE-HCTZ 37.5-25 MG PO CAPS: 1.0000 | ORAL_CAPSULE | Freq: Every day | ORAL | 3 refills | Status: DC

## 2021-11-18 MED ORDER — AMLODIPINE BESYLATE 10 MG PO TABS: ORAL_TABLET | ORAL | 3 refills | Status: DC
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
## 2021-11-19 LAB — CBC WITH DIFFERENTIAL/PLATELET
Basophils Absolute: 0.1 10*3/uL (ref 0.0–0.2)
Basos: 1 %
EOS (ABSOLUTE): 0.1 10*3/uL (ref 0.0–0.4)
Eos: 1 %
Hematocrit: 45.5 % (ref 37.5–51.0)
Hemoglobin: 15.2 g/dL (ref 13.0–17.7)
Immature Grans (Abs): 0 10*3/uL (ref 0.0–0.1)
Immature Granulocytes: 0 %
Lymphocytes Absolute: 1.4 10*3/uL (ref 0.7–3.1)
Lymphs: 15 %
MCH: 28.2 pg (ref 26.6–33.0)
MCHC: 33.4 g/dL (ref 31.5–35.7)
MCV: 84 fL (ref 79–97)
Monocytes Absolute: 1 10*3/uL — ABNORMAL HIGH (ref 0.1–0.9)
Monocytes: 10 %
Neutrophils Absolute: 7.3 10*3/uL — ABNORMAL HIGH (ref 1.4–7.0)
Neutrophils: 73 %
Platelets: 310 10*3/uL (ref 150–450)
RBC: 5.39 x10E6/uL (ref 4.14–5.80)
RDW: 13.2 % (ref 11.6–15.4)
WBC: 9.9 10*3/uL (ref 3.4–10.8)

## 2021-11-19 LAB — COMPREHENSIVE METABOLIC PANEL
ALT: 23 IU/L (ref 0–44)
AST: 22 IU/L (ref 0–40)
Albumin/Globulin Ratio: 1.6 (ref 1.2–2.2)
Albumin: 5.1 g/dL — ABNORMAL HIGH (ref 3.8–4.8)
Alkaline Phosphatase: 84 IU/L (ref 44–121)
BUN/Creatinine Ratio: 14 (ref 10–24)
BUN: 18 mg/dL (ref 8–27)
Bilirubin Total: 0.6 mg/dL (ref 0.0–1.2)
CO2: 27 mmol/L (ref 20–29)
Calcium: 10 mg/dL (ref 8.6–10.2)
Chloride: 96 mmol/L (ref 96–106)
Creatinine, Ser: 1.3 mg/dL — ABNORMAL HIGH (ref 0.76–1.27)
Globulin, Total: 3.2 g/dL (ref 1.5–4.5)
Glucose: 94 mg/dL (ref 70–99)
Potassium: 3.7 mmol/L (ref 3.5–5.2)
Sodium: 139 mmol/L (ref 134–144)
Total Protein: 8.3 g/dL (ref 6.0–8.5)
eGFR: 62 mL/min/{1.73_m2} (ref >59)

## 2021-11-19 LAB — LIPID PANEL W/O CHOL/HDL RATIO
Cholesterol, Total: 169 mg/dL (ref 100–199)
HDL: 44 mg/dL (ref >39)
LDL Chol Calc (NIH): 101 mg/dL — ABNORMAL HIGH (ref 0–99)
Triglycerides: 135 mg/dL (ref 0–149)
VLDL Cholesterol Cal: 24 mg/dL (ref 5–40)

## 2022-05-19 ENCOUNTER — Encounter: Payer: Self-pay | Admitting: Internal Medicine

## 2022-05-19 ENCOUNTER — Ambulatory Visit (INDEPENDENT_AMBULATORY_CARE_PROVIDER_SITE_OTHER): Payer: Commercial Managed Care - HMO | Admitting: Internal Medicine

## 2022-05-19 VITALS — BP 146/90 | HR 100 | Resp 16 | Ht 72.0 in | Wt 204.0 lb

## 2022-05-19 DIAGNOSIS — Z1211 Encounter for screening for malignant neoplasm of colon: Secondary | ICD-10-CM

## 2022-05-19 DIAGNOSIS — G609 Hereditary and idiopathic neuropathy, unspecified: Secondary | ICD-10-CM

## 2022-05-19 DIAGNOSIS — Z125 Encounter for screening for malignant neoplasm of prostate: Secondary | ICD-10-CM

## 2022-05-19 DIAGNOSIS — I10 Essential (primary) hypertension: Secondary | ICD-10-CM

## 2022-05-19 DIAGNOSIS — Z23 Encounter for immunization: Secondary | ICD-10-CM | POA: Diagnosis not present

## 2022-05-19 DIAGNOSIS — Z0000 Encounter for general adult medical examination without abnormal findings: Secondary | ICD-10-CM | POA: Diagnosis not present

## 2022-05-19 DIAGNOSIS — R7303 Prediabetes: Secondary | ICD-10-CM | POA: Diagnosis not present

## 2022-05-19 MED ORDER — LOSARTAN POTASSIUM 50 MG PO TABS: 50.0000 mg | ORAL_TABLET | Freq: Every day | ORAL | 11 refills | Status: DC

## 2022-05-19 NOTE — Patient Instructions (Signed)

## 2022-05-19 NOTE — Progress Notes (Signed)
Subjective:    Patient ID: Carl Wright, male   DOB: Nov 10, 1955, 66 y.o.   MRN: [id number]   HPI  Here for Male CPE:  1.  STE:  Does perform.  No changes.  No family history of testicular cancer.    2.  PSA: Normal at 1.0 in 2021.  No family history of prostate cancer.    3.  Guaiac Cards/FIT:  Negative stool cards x 3 in 2021.    4.  Colonoscopy: Never.  No family history of colon cancer.    5.  Cholesterol/Glucose:  Cholesterol was okay in January, though HDL trending down and LDL trending up.  He does not describe a good diet.  Resistant to change.   History of prediabetes with A1C at 6.1% in 2021.  Normal fasting glucose 10/2021.    6.  Immunizations:  Has not had Shingrix and after discussion, refuses.  Has not had bivalent COVID and willling to consider.   Immunization History  Administered Date(s) Administered   Influenza-Unspecified 08/08/2021   Moderna Sars-Covid-2 Vaccination 03/15/2020, 04/12/2020   Tdap 10/23/2013     7.  Hypertension:  only missed meds once this month.  Has a home BP monitor and shows 132/90 regularly.  BPs regularly elevated.  States took meds this morning at usual time.  Current Meds  Medication Sig   amLODipine (NORVASC) 10 MG tablet 1 tab by mouth daily   triamterene-hydrochlorothiazide (DYAZIDE) 37.5-25 MG capsule Take 1 each (1 capsule total) by mouth daily.   Allergies  Allergen Reactions   Lisinopril     States caused gynecomastia in left breast area.   Past Medical History:  Diagnosis Date   History of hepatitis C 2018   Treated with oral therapy for 84 days and viral titre reportedly negative thereafter.  In prison   Hypertension 2010   Prediabetes 2017   weighed 25 lbs more then   Umbilical hernia 12/02/4707   History reviewed. No pertinent surgical history.  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     Drug use: Not Currently    Comment: Hx in 1980s.   Sexual activity: Yes  Other Topics Concern   Not on file  Social History Narrative   Recently released from prison for food stamp fraud--5 year sentence for which he was released  Jan 2020.    His wife is originally [place] and caught up in same situation.  ICE has detained her for the past 5 years.  He thought she was to be released to him when he got out of prison--so he has this stress as well. (Old history:  Maudry Mayhew, his wife has been out for some time (2023 documentation)   Her green card expired 2 weeks before she was arrested.   Living with his wife and 2 teenage step granddaughters--very dysfunctional extended family.   3/4 [place] Native American   Also [place] and [place] American      Social Determinants of Health   Financial Resource Strain: Low Risk  (05/19/2022)   Overall Financial Resource Strain (CARDIA)    Difficulty of Paying Living Expenses: Not hard at all  Food Insecurity: No Food Insecurity (05/19/2022)   Hunger Vital Sign    Worried About Running Out of Food in the Last Year: Never true    Ran Out of Food in the Last Year: Never true  Transportation  Needs: No Transportation Needs (05/19/2022)   PRAPARE - Hydrologist (Medical): No    Lack of Transportation (Non-Medical): No  Physical Activity: Sufficiently Active (12/02/2018)   Exercise Vital Sign    Days of Exercise per Week: 7 days    Minutes of Exercise per Session: 30 min  Stress: Stress Concern Present (12/02/2018)   [place]    Feeling of Stress : Very much  Social Connections: Not on file  Intimate Partner Violence: Not At Risk (05/19/2022)   Humiliation, Afraid, Rape, and Kick questionnaire    Fear of Current or Ex-Partner: No    Emotionally Abused: No    Physically Abused: No    Sexually Abused: No     Review of Systems  Constitutional:        Very sedentary now.    HENT:  Negative for dental problem (No interest in obtaining care.).   Eyes:  Negative for visual disturbance (Not clear he is willing to obtain care.).  Respiratory:  Negative for cough and shortness of breath.   Cardiovascular:  Negative for chest pain, palpitations and leg swelling.  Gastrointestinal:  Negative for abdominal pain and blood in stool (no melena).  Endocrine: Positive for polydipsia and polyuria.  Neurological:  Positive for numbness (numbness and tingling across MT joint area bilaterally.  States started when in prison and given bad shoes.).        Objective:   BP (!) 146/90 (BP Location: Left Arm, Patient Position: Sitting, Cuff Size: Normal)   Pulse 100   Resp 16   Ht 6' (1.829 m)   Wt 204 lb (92.5 kg)   BMI 27.67 kg/m   Physical Exam HENT:     Head: Normocephalic and atraumatic.     Right Ear: Tympanic membrane, ear canal and external ear normal.     Left Ear: There is impacted cerumen.     Nose: Nose normal.     Mouth/Throat:     Mouth: Mucous membranes are moist.     Pharynx: Oropharynx is clear.  Eyes:     Extraocular Movements: Extraocular movements intact.      Conjunctiva/sclera: Conjunctivae normal.     Pupils: Pupils are equal, round, and reactive to light.     Comments: Discs appear sharp  Neck:     Thyroid: No thyroid mass or thyromegaly.  Cardiovascular:  Rate and Rhythm: Normal rate and regular rhythm.     Pulses:          Dorsalis pedis pulses are 2+ on the right side and 2+ on the left side.       Posterior tibial pulses are 2+ on the right side and 2+ on the left side.     Heart sounds: S1 normal and S2 normal. No murmur heard.    No friction rub. No S3 or S4 sounds.     Comments: No carotid bruits.  Carotid, radial, femoral, DP and PT pulses normal and equal.    Pulmonary:     Effort: Pulmonary effort is normal.     Breath sounds: Normal breath sounds.  Abdominal:     General: Bowel sounds are normal.     Palpations: Abdomen is soft. There is no hepatomegaly, splenomegaly or mass.     Tenderness: There is no abdominal tenderness.     Hernia: A hernia (Umbilical hernia.  Easily reducible.) is present.  Genitourinary:    Testes:        Right: Mass or tenderness not present. Right testis is descended.        Left: Mass or tenderness not present. Left testis is descended.  Musculoskeletal:        General: Normal range of motion.     Cervical back: Normal range of motion and neck supple.     Right lower leg: No edema.     Left lower leg: No edema.  Feet:     Right foot:     Protective Sensation: 10 sites tested.  10 sites sensed.     Skin integrity: Dry skin and fissure present.     Left foot:     Protective Sensation: 10 sites tested.  10 sites sensed.     Skin integrity: Dry skin and fissure present.  Lymphadenopathy:     Head:     Right side of head: No submental or submandibular adenopathy.     Left side of head: No submental or submandibular adenopathy.     Cervical: No cervical adenopathy.     Upper Body:     Right upper body: No supraclavicular or axillary adenopathy.     Left upper body: No supraclavicular or  axillary adenopathy.     Lower Body: No right inguinal adenopathy. No left inguinal adenopathy.  Skin:    General: Skin is warm.     Findings: Rash (darkening and thickening almost velvety change to skin at upper and L/S back.  Not at neck.) present.  Neurological:     General: No focal deficit present.     Mental Status: He is alert and oriented to person, place, and time.     Cranial Nerves: Cranial nerves 2-12 are intact.     Sensory: Sensation is intact.     Motor: Motor function is intact.     Coordination: Coordination is intact.     Gait: Gait is intact.     Deep Tendon Reflexes: Reflexes are normal and symmetric.  Psychiatric:        Speech: Speech normal.        Behavior: Behavior normal. Behavior is cooperative.      Assessment & Plan   CPE PSA, A1C, CmP, CBC, B12, RBC folate, TSH Referral for screening colonoscopy, but to complete FIT as well as uncertain coverage in near future. Refused Shingrix Moderna Bivalent  2.  Hypertension:  not controlled.  Add Losartan 50 mg daily.  BMP in 2 weeks when brings FIT back.  BP check in 1 month  3.  Prediabetes history :  A1C--concerned developing DM with polydipsia, polyuria  4.  Peripheral neuropathy:  see labs above.  5.  Tinea pedis:  Terbinafine cream twice daily to feet for 14 days.  6.  Umbilical hernia:  not interested in having repaired at this time.

## 2022-05-20 LAB — COMPREHENSIVE METABOLIC PANEL
ALT: 19 IU/L (ref 0–44)
AST: 23 IU/L (ref 0–40)
Albumin/Globulin Ratio: 1.5 (ref 1.2–2.2)
Albumin: 5 g/dL — ABNORMAL HIGH (ref 3.9–4.9)
Alkaline Phosphatase: 78 IU/L (ref 44–121)
BUN/Creatinine Ratio: 13 (ref 10–24)
BUN: 16 mg/dL (ref 8–27)
Bilirubin Total: 0.5 mg/dL (ref 0.0–1.2)
CO2: 25 mmol/L (ref 20–29)
Calcium: 10.4 mg/dL — ABNORMAL HIGH (ref 8.6–10.2)
Chloride: 95 mmol/L — ABNORMAL LOW (ref 96–106)
Creatinine, Ser: 1.19 mg/dL (ref 0.76–1.27)
Globulin, Total: 3.3 g/dL (ref 1.5–4.5)
Glucose: 119 mg/dL — ABNORMAL HIGH (ref 70–99)
Potassium: 4 mmol/L (ref 3.5–5.2)
Sodium: 139 mmol/L (ref 134–144)
Total Protein: 8.3 g/dL (ref 6.0–8.5)
eGFR: 69 mL/min/{1.73_m2} (ref >59)

## 2022-05-20 LAB — CBC WITH DIFFERENTIAL/PLATELET
Basophils Absolute: 0.1 10*3/uL (ref 0.0–0.2)
Basos: 1 %
EOS (ABSOLUTE): 0.1 10*3/uL (ref 0.0–0.4)
Eos: 1 %
Hematocrit: 48.7 % (ref 37.5–51.0)
Hemoglobin: 15.8 g/dL (ref 13.0–17.7)
Immature Grans (Abs): 0 10*3/uL (ref 0.0–0.1)
Immature Granulocytes: 0 %
Lymphocytes Absolute: 1.8 10*3/uL (ref 0.7–3.1)
Lymphs: 17 %
MCH: 27.8 pg (ref 26.6–33.0)
MCHC: 32.4 g/dL (ref 31.5–35.7)
MCV: 86 fL (ref 79–97)
Monocytes Absolute: 0.8 10*3/uL (ref 0.1–0.9)
Monocytes: 8 %
Neutrophils Absolute: 7.8 10*3/uL — ABNORMAL HIGH (ref 1.4–7.0)
Neutrophils: 73 %
Platelets: 326 10*3/uL (ref 150–450)
RBC: 5.68 x10E6/uL (ref 4.14–5.80)
RDW: 13.1 % (ref 11.6–15.4)
WBC: 10.7 10*3/uL (ref 3.4–10.8)

## 2022-05-20 LAB — HGB A1C W/O EAG: Hgb A1c MFr Bld: 6.1 % — ABNORMAL HIGH (ref 4.8–5.6)

## 2022-05-20 LAB — TSH: TSH: 1.47 u[IU]/mL (ref 0.450–4.500)

## 2022-05-20 LAB — PSA: Prostate Specific Ag, Serum: 1 ng/mL (ref 0.0–4.0)

## 2022-05-20 LAB — FOLATE RBC

## 2022-05-20 LAB — VITAMIN B12: Vitamin B-12: 660 pg/mL (ref 232–1245)

## 2022-06-02 ENCOUNTER — Other Ambulatory Visit: Payer: Commercial Managed Care - HMO

## 2022-06-05 ENCOUNTER — Other Ambulatory Visit (INDEPENDENT_AMBULATORY_CARE_PROVIDER_SITE_OTHER): Payer: Commercial Managed Care - HMO

## 2022-06-05 VITALS — BP 138/82 | HR 96

## 2022-06-05 DIAGNOSIS — Z1211 Encounter for screening for malignant neoplasm of colon: Secondary | ICD-10-CM | POA: Diagnosis not present

## 2022-06-05 DIAGNOSIS — Z79899 Other long term (current) drug therapy: Secondary | ICD-10-CM | POA: Diagnosis not present

## 2022-06-05 DIAGNOSIS — Z0130 Encounter for examination of blood pressure without abnormal findings: Secondary | ICD-10-CM

## 2022-06-05 LAB — POC FIT TEST STOOL: Fecal Occult Blood: NEGATIVE

## 2022-06-05 NOTE — Progress Notes (Signed)
Patient reported that he is taking bp medication consistently.  After reporting bp to Dr Amil Amen, no changes to medications will be made.

## 2022-06-05 NOTE — Addendum Note (Signed)
Addended by: Mariah Milling on: 06/05/2022 04:34 PM   Modules accepted: Orders

## 2022-06-06 LAB — BASIC METABOLIC PANEL
BUN/Creatinine Ratio: 14 (ref 10–24)
BUN: 15 mg/dL (ref 8–27)
CO2: 24 mmol/L (ref 20–29)
Calcium: 9.6 mg/dL (ref 8.6–10.2)
Chloride: 97 mmol/L (ref 96–106)
Creatinine, Ser: 1.07 mg/dL (ref 0.76–1.27)
Glucose: 117 mg/dL — ABNORMAL HIGH (ref 70–99)
Potassium: 3.7 mmol/L (ref 3.5–5.2)
Sodium: 136 mmol/L (ref 134–144)
eGFR: 78 mL/min/{1.73_m2} (ref >59)

## 2022-07-18 ENCOUNTER — Ambulatory Visit: Payer: Commercial Managed Care - HMO | Admitting: Internal Medicine

## 2022-07-27 ENCOUNTER — Ambulatory Visit: Payer: Commercial Managed Care - HMO | Admitting: Internal Medicine

## 2022-07-31 ENCOUNTER — Ambulatory Visit: Payer: Commercial Managed Care - HMO | Admitting: Internal Medicine

## 2022-07-31 ENCOUNTER — Encounter: Payer: Self-pay | Admitting: Internal Medicine

## 2022-07-31 ENCOUNTER — Ambulatory Visit: Payer: 59 | Admitting: Internal Medicine

## 2022-07-31 VITALS — BP 148/82 | HR 116 | Resp 16 | Ht 72.0 in | Wt 205.0 lb

## 2022-07-31 DIAGNOSIS — R7303 Prediabetes: Secondary | ICD-10-CM | POA: Diagnosis not present

## 2022-07-31 DIAGNOSIS — I10 Essential (primary) hypertension: Secondary | ICD-10-CM

## 2022-07-31 NOTE — Patient Instructions (Signed)
Take Amlodipine in the morning Take the Losartan with your evening meal Hold your Triamterene/HCTZ for now.

## 2022-07-31 NOTE — Progress Notes (Signed)
    Subjective:    Patient ID: Carl Wright, male   DOB: Oct 25, 1955, 66 y.o.   MRN: [id number]   HPI  Hypertension:  Stopped the Losartan as he feels it was making him light headed.  He did follow up on 06/05/22 and BP was finally below goal.  Was also taking amlodipine and Dyazide.  He was taking all 3 bp meds at same time in morning.  HM: refusing covid, influenza, and shingles vaccination.    Current Meds  Medication Sig   amLODipine (NORVASC) 10 MG tablet 1 tab by mouth daily   triamterene-hydrochlorothiazide (DYAZIDE) 37.5-25 MG capsule Take 1 each (1 capsule total) by mouth daily.   Allergies  Allergen Reactions   Lisinopril     States caused gynecomastia in left breast area.     Review of Systems    Objective:   BP (!) 148/82 (BP Location: Right Arm, Patient Position: Sitting, Cuff Size: Normal)   Pulse (!) 116   Resp 16   Ht 6' (1.829 m)   Wt 205 lb (93 kg)   BMI 27.80 kg/m   Physical Exam NAD Lungs:  CTA CV:  RRR without murmur or rub.  Radial pulses normal and equal LE:  No edema.  Addendum:  left cerumen impaction--dark wa in left ear canal--appears packed in.     Assessment & Plan    Hypertension:  Addition of Losartan finally resulted in normal range BP.  Will try taking Amlodipine in the morning, hold Dyazide and take Losartan in the evening.    2.   Prediabetes:  drinking sugary drinks including tea and juices.  Encouraged to work on eating whole foods with fiber.  Not clear he is listening to recommendations.  3.  Left cerumen impaction of ear:  He was trying to suck the cerumen out with bulb suction instead of flushing.  Instructions given on placing drop in ear for 2 days and then flushing with warm water.  No Q tips in ear canal.   4.  Would like to get set up with colonoscopy.  Was told his new [employer] insurance covered, but now knows has $7000 deductible.  Will check with insurance whether preventive care is paid for or if part of deductible or  not and get back.

## 2022-08-10 ENCOUNTER — Ambulatory Visit (INDEPENDENT_AMBULATORY_CARE_PROVIDER_SITE_OTHER): Payer: Commercial Managed Care - HMO

## 2022-08-10 VITALS — BP 138/78 | HR 88

## 2022-08-10 DIAGNOSIS — Z0130 Encounter for examination of blood pressure without abnormal findings: Secondary | ICD-10-CM

## 2022-08-10 NOTE — Progress Notes (Signed)
Patient has been taking bp medication consistently. Patient reported that he does not get dizzy spells.   After reporting bp to Dr Amil Amen, no changes to medications will be made.

## 2022-08-11 ENCOUNTER — Encounter: Payer: Self-pay | Admitting: Gastroenterology

## 2022-08-25 ENCOUNTER — Ambulatory Visit (AMBULATORY_SURGERY_CENTER): Payer: Self-pay

## 2022-08-25 VITALS — Ht 73.0 in | Wt 206.0 lb

## 2022-08-25 DIAGNOSIS — Z1211 Encounter for screening for malignant neoplasm of colon: Secondary | ICD-10-CM

## 2022-08-25 MED ORDER — NA SULFATE-K SULFATE-MG SULF 17.5-3.13-1.6 GM/177ML PO SOLN: 1.0000 | Freq: Once | ORAL | 0 refills | Status: AC

## 2022-08-25 NOTE — Progress Notes (Signed)
No egg or soy allergy known to patient  No issues known to pt with past sedation with any surgeries or procedures Patient denies ever being told they had issues or difficulty with intubation  No FH of Malignant Hyperthermia; Pt is not on diet pills; Pt is not on home 02;  Pt is not on blood thinners;  Pt reports issues with constipation - patient reports he has been taking OTC meds and "home remedies" to help with constipation- once per month constipation- advised patient to increase oral fluids, activity, and fruits/veggies; patient reports he has also been taking in prune juice and/or baking soda as a way of relief;  No A fib or A flutter; Have any cardiac testing pending--NO Pt instructed to use Singlecare.com or GoodRx for a price reduction on prep   Insurance verified during PV appt=[employer]  Previsit completed and red dot placed by patient's name on their procedure day (on provider's schedule).    GoodRx coupon given to patient during PV appt;  patient agreed to change pharmacies just for this prep Rx;  Wife present during PV appt;

## 2022-09-05 ENCOUNTER — Encounter: Payer: Self-pay | Admitting: Gastroenterology

## 2022-09-08 ENCOUNTER — Encounter: Payer: Self-pay | Admitting: Gastroenterology

## 2022-09-08 ENCOUNTER — Ambulatory Visit (AMBULATORY_SURGERY_CENTER): Payer: Self-pay | Admitting: Gastroenterology

## 2022-09-08 VITALS — BP 116/78 | HR 66 | Temp 97.5°F | Resp 10 | Ht 73.0 in | Wt 206.0 lb

## 2022-09-08 DIAGNOSIS — D122 Benign neoplasm of ascending colon: Secondary | ICD-10-CM

## 2022-09-08 DIAGNOSIS — D124 Benign neoplasm of descending colon: Secondary | ICD-10-CM

## 2022-09-08 DIAGNOSIS — D123 Benign neoplasm of transverse colon: Secondary | ICD-10-CM | POA: Diagnosis not present

## 2022-09-08 DIAGNOSIS — Z1211 Encounter for screening for malignant neoplasm of colon: Secondary | ICD-10-CM

## 2022-09-08 MED ORDER — SODIUM CHLORIDE 0.9 % IV SOLN: 500.0000 mL | Freq: Once | INTRAVENOUS | Status: DC

## 2022-09-08 NOTE — Progress Notes (Signed)
   Referring Provider: Mack Hook, MD Primary Care Physician:  Mack Hook, MD   Indication for Colonoscopy:  Colon cancer screening   IMPRESSION:  Need for colon cancer screening Appropriate candidate for monitored anesthesia care  PLAN: Colonoscopy in the [facility] today   HPI: Carl Wright is a 66 y.o. male presents for screening colonoscopy.  No prior colonoscopy or colon cancer screening.  No known family history of colon cancer or polyps. No family history of uterine/endometrial cancer, pancreatic cancer or gastric/stomach cancer.   Past Medical History:  Diagnosis Date   History of hepatitis C 2018   Treated with oral therapy for 84 days and viral titre reportedly negative thereafter.  In prison   Hypertension 2010   on meds   Pre-diabetes    diet controlled   Prediabetes 2017   weighed 25 lbs more then   Umbilical hernia 95/28/4132    Past Surgical History:  Procedure Laterality Date   LIVER BIOPSY  2007    Current Outpatient Medications  Medication Sig Dispense Refill   amLODipine (NORVASC) 10 MG tablet 1 tab by mouth daily (Patient taking differently: Take 10 mg by mouth daily.) 90 tablet 3   losartan (COZAAR) 50 MG tablet Take 1 tablet (50 mg total) by mouth daily. 30 tablet 11   Current Facility-Administered Medications  Medication Dose Route Frequency Provider Last Rate Last Admin   0.9 %  sodium chloride infusion  500 mL Intravenous Once Thornton Park, MD        Allergies as of 09/08/2022 - Review Complete 09/08/2022  Allergen Reaction Noted   Lisinopril  12/02/2018    Family History  Problem Relation Age of Onset   Pneumonia Mother    Heart disease Father 59       MI was cause of death   Stroke Father    Hypertension Father    Diabetes Father    COPD Sister    Hypertension Sister    Diabetes Sister    Colon polyps Neg Hx    Colon cancer Neg Hx    Esophageal cancer Neg Hx    Stomach cancer Neg Hx    Rectal cancer Neg  Hx      Physical Exam: General:   Alert,  well-nourished, pleasant and cooperative in NAD Head:  Normocephalic and atraumatic. Eyes:  Sclera clear, no icterus.   Conjunctiva pink. Mouth:  No deformity or lesions.   Neck:  Supple; no masses or thyromegaly. Lungs:  Clear throughout to auscultation.   No wheezes. Heart:  Regular rate and rhythm; no murmurs. Abdomen:  Soft, non-tender, nondistended, normal bowel sounds, no rebound or guarding.  Msk:  Symmetrical. No boney deformities LAD: No inguinal or umbilical LAD Extremities:  No clubbing or edema. Neurologic:  Alert and  oriented x4;  grossly nonfocal Skin:  No obvious rash or bruise. Psych:  Alert and cooperative. Normal mood and affect.     Studies/Results: No results found.    [name] L. Tarri Glenn, MD, MPH 09/08/2022, 1:36 PM

## 2022-09-08 NOTE — Op Note (Signed)
[place] Patient Name: Carl Wright Procedure Date: 09/08/2022 1:36 PM MRN: [id number] Endoscopist: Thornton Park MD, MD, [id number] Age: 66 Referring MD:  Date of Birth: 03/14/1956 Gender: Male Account #: [id number] Procedure:                Colonoscopy Indications:              Screening for colorectal malignant neoplasm, This                            is the patient's first colonoscopy                           No known family history of colon cancer or polyps Medicines:                Monitored Anesthesia Care Procedure:                Pre-Anesthesia Assessment:                           - Prior to the procedure, a History and Physical                            was performed, and patient medications and                            allergies were reviewed. The patient's tolerance of                            previous anesthesia was also reviewed. The risks                            and benefits of the procedure and the sedation                            options and risks were discussed with the patient.                            All questions were answered, and informed consent                            was obtained. Prior Anticoagulants: The patient has                            taken no anticoagulant or antiplatelet agents. ASA                            Grade Assessment: II - A patient with mild systemic                            disease. After reviewing the risks and benefits,                            the patient was deemed in satisfactory condition to  undergo the procedure.                           After obtaining informed consent, the colonoscope                            was passed under direct vision. Throughout the                            procedure, the patient's blood pressure, pulse, and                            oxygen saturations were monitored continuously. The                            Olympus CF-HQ190L  [id number]) Colonoscope was                            introduced through the anus and advanced to the 1                            cm into the ileum. A second forward view of the                            right colon was performed. The colonoscopy was                            performed without difficulty. The patient tolerated                            the procedure well. The quality of the bowel                            preparation was good. The terminal ileum, ileocecal                            valve, appendiceal orifice, and rectum were                            photographed. Scope In: 1:46:40 PM Scope Out: 2:12:00 PM Scope Withdrawal Time: 0 hours 17 minutes 26 seconds  Total Procedure Duration: 0 hours 25 minutes 20 seconds  Findings:                 The perianal and digital rectal examinations were                            normal.                           Non-bleeding internal hemorrhoids were found.                           A 3 mm polyp was found in the descending colon. The  polyp was sessile. The polyp was removed with a                            cold snare. Resection and retrieval were complete.                            Estimated blood loss was minimal.                           A 4 mm polyp was found in the transverse colon. The                            polyp was sessile. The polyp was removed with a                            cold snare. Resection and retrieval were complete.                            Estimated blood loss was minimal.                           A 3 mm polyp was found in the hepatic flexure. The                            polyp was sessile. The polyp was removed with a                            cold snare. Resection and retrieval were complete.                            Estimated blood loss was minimal.                           Three sessile polyps were found in the ascending                            colon. The  polyps were 2 to 6 mm in size. These                            polyps were removed with a cold snare. Resection                            and retrieval were complete. Estimated blood loss                            was minimal.                           The exam was otherwise without abnormality on                            direct and retroflexion views. Complications:  No immediate complications. Estimated Blood Loss:     Estimated blood loss was minimal. Impression:               - Non-bleeding internal hemorrhoids.                           - One 3 mm polyp in the descending colon, removed                            with a cold snare. Resected and retrieved.                           - One 4 mm polyp in the transverse colon, removed                            with a cold snare. Resected and retrieved.                           - One 3 mm polyp at the hepatic flexure, removed                            with a cold snare. Resected and retrieved.                           - Three 2 to 6 mm polyps in the ascending colon,                            removed with a cold snare. Resected and retrieved.                           - The examination was otherwise normal on direct                            and retroflexion views. Recommendation:           - Patient has a contact number available for                            emergencies. The signs and symptoms of potential                            delayed complications were discussed with the                            patient. Return to normal activities tomorrow.                            Written discharge instructions were provided to the                            patient.                           - Resume previous diet.                           -  Continue present medications.                           - Await pathology results.                           - Repeat colonoscopy in 3 years for surveillance if                             at least 3 polyps are adenomas.                           - Emerging evidence supports eating a diet of                            fruits, vegetables, grains, calcium, and yogurt                            while reducing red meat and alcohol may reduce the                            risk of colon cancer.                           - Thank you for allowing me to be involved in your                            colon cancer prevention. Thornton Park MD, MD 09/08/2022 2:20:10 PM This report has been signed electronically.

## 2022-09-08 NOTE — Patient Instructions (Signed)
Thank you for letting us take care of your healthcare needs today. PLease see handouts given to you on Polyps.    YOU HAD AN ENDOSCOPIC PROCEDURE TODAY AT [facility]:   Refer to the procedure report that was given to you for any specific questions about what was found during the examination.  If the procedure report does not answer your questions, please call your gastroenterologist to clarify.  If you requested that your care partner not be given the details of your procedure findings, then the procedure report has been included in a sealed envelope for you to review at your convenience later.  YOU SHOULD EXPECT: Some feelings of bloating in the abdomen. Passage of more gas than usual.  Walking can help get rid of the air that was put into your GI tract during the procedure and reduce the bloating. If you had a lower endoscopy (such as a colonoscopy or flexible sigmoidoscopy) you may notice spotting of blood in your stool or on the toilet paper. If you underwent a bowel prep for your procedure, you may not have a normal bowel movement for a few days.  Please Note:  You might notice some irritation and congestion in your nose or some drainage.  This is from the oxygen used during your procedure.  There is no need for concern and it should clear up in a day or so.  SYMPTOMS TO REPORT IMMEDIATELY:  Following lower endoscopy (colonoscopy or flexible sigmoidoscopy):  Excessive amounts of blood in the stool  Significant tenderness or worsening of abdominal pains  Swelling of the abdomen that is new, acute  Fever of 100F or higher   For urgent or emergent issues, a gastroenterologist can be reached at any hour by calling [phone]. Do not use MyChart messaging for urgent concerns.    DIET:  We do recommend a small meal at first, but then you may proceed to your regular diet.  Drink plenty of fluids but you should avoid alcoholic beverages for 24 hours.  ACTIVITY:  You  should plan to take it easy for the rest of today and you should NOT DRIVE or use heavy machinery until tomorrow (because of the sedation medicines used during the test).    FOLLOW UP: Our staff will call the number listed on your records the next business day following your procedure.  We will call around 7:15- 8:00 am to check on you and address any questions or concerns that you may have regarding the information given to you following your procedure. If we do not reach you, we will leave a message.     If any biopsies were taken you will be contacted by phone or by letter within the next 1-3 weeks.  Please call us at [phone] if you have not heard about the biopsies in 3 weeks.    SIGNATURES/CONFIDENTIALITY: You and/or your care partner have signed paperwork which will be entered into your electronic medical record.  These signatures attest to the fact that that the information above on your After Visit Summary has been reviewed and is understood.  Full responsibility of the confidentiality of this discharge information lies with you and/or your care-partner.

## 2022-09-08 NOTE — Progress Notes (Signed)
Pt's states no medical or surgical changes since previsit or office visit. VS assessed by C.W 

## 2022-09-08 NOTE — Progress Notes (Signed)
Report to PACU, RN, vss, BBS= Clear.  

## 2022-09-08 NOTE — Progress Notes (Signed)
Called to room to assist during endoscopic procedure.  Patient ID and intended procedure confirmed with present staff. Received instructions for my participation in the procedure from the performing physician.  

## 2022-09-11 ENCOUNTER — Telehealth: Payer: Self-pay | Admitting: *Deleted

## 2022-09-11 NOTE — Telephone Encounter (Signed)
Patient denied nausea at this time.  Able to eat eggs andtoast.  Addendum

## 2022-09-11 NOTE — Telephone Encounter (Signed)
  Follow up Call-     09/08/2022    1:19 PM  Call back number  Post procedure Call Back phone  # [phone]  Permission to leave phone message Yes     Patient questions:  Do you have a fever, pain , or abdominal swelling? No. Pain Score  0 *  Have you tolerated food without any problems? Yes.    Have you been able to return to your normal activities? Yes.    Do you have any questions about your discharge instructions: Diet   No. Medications  No. Follow up visit  No.  Do you have questions or concerns about your Care? Yes.    Actions: * If pain score is 4 or above: No action needed, pain <4.  Patient answered picked up the phone and did not say anything until I identified myself.Patient stated that he vomited over the weekend, and that he had abd pain about #9.  States much better today with a pain score of 3.  States that he does have a large umbilical hernia, and sometimes it "gives him problems."  Patient did not call the emergency room number given to him on Friday for discharge.  Told patient to call if vomiting comes back kor pain worsens.

## 2022-09-18 ENCOUNTER — Encounter: Payer: Self-pay | Admitting: Gastroenterology

## 2022-11-20 ENCOUNTER — Encounter: Payer: Self-pay | Admitting: Internal Medicine

## 2022-11-20 ENCOUNTER — Ambulatory Visit: Payer: Commercial Managed Care - HMO | Admitting: Internal Medicine

## 2022-11-20 ENCOUNTER — Ambulatory Visit: Payer: 59 | Admitting: Internal Medicine

## 2022-11-20 VITALS — BP 144/98 | HR 88 | Ht 72.0 in | Wt 205.0 lb

## 2022-11-20 DIAGNOSIS — I10 Essential (primary) hypertension: Secondary | ICD-10-CM

## 2022-11-20 DIAGNOSIS — G609 Hereditary and idiopathic neuropathy, unspecified: Secondary | ICD-10-CM

## 2022-11-20 DIAGNOSIS — Z79899 Other long term (current) drug therapy: Secondary | ICD-10-CM | POA: Diagnosis not present

## 2022-11-20 DIAGNOSIS — N529 Male erectile dysfunction, unspecified: Secondary | ICD-10-CM

## 2022-11-20 DIAGNOSIS — Z13220 Encounter for screening for lipoid disorders: Secondary | ICD-10-CM

## 2022-11-20 DIAGNOSIS — K429 Umbilical hernia without obstruction or gangrene: Secondary | ICD-10-CM

## 2022-11-20 DIAGNOSIS — R7303 Prediabetes: Secondary | ICD-10-CM

## 2022-11-20 MED ORDER — TADALAFIL 5 MG PO TABS: 5.0000 mg | ORAL_TABLET | Freq: Every day | ORAL | 6 refills | Status: DC

## 2022-11-20 MED ORDER — AMLODIPINE BESYLATE 10 MG PO TABS: ORAL_TABLET | ORAL | 3 refills | Status: DC

## 2022-11-20 NOTE — Progress Notes (Signed)
    Subjective:    Patient ID: Carl Wright, male   DOB: 1956/01/16, 67 y.o.   MRN: [id number]   HPI   Hypertension:  admits to missing Losartan at times as he takes this in the evening.  If he takes Losartan and Amlodipine together in the morning, he gets dizzy.  He does not have a schedule for meals in the evening.  His wife also really without a schedule in the evening.  BP was fine during colonoscopy, but generally a bit high.    Does have a pillbox Also, states stressed with financial issues and not eating in a healthy way.    Has a stationary bike, but does not use frequently Wants to get weights.   2.  HM:  had colonoscopy with Dr. Tarri Glenn, GI.  Had 6 Adenomas without dysplasia.  Plan for follow up in 3 years/2026. Has not had influenza of COVID 2023-2024 booster.  Not interested in Shingrix.    3.  Tingling in feet:  Folic acid level was not run last year when ordered.  He has noted if he drinks soda, the tingling is bad.  States he stopped drinking them.    4.  ED:  would like refill of Viagra.  Appears Cialis is preferred.    Current Meds  Medication Sig   amLODipine (NORVASC) 10 MG tablet 1 tab by mouth daily (Patient taking differently: Take 10 mg by mouth daily.)   losartan (COZAAR) 50 MG tablet Take 1 tablet (50 mg total) by mouth daily.   Allergies  Allergen Reactions   Lisinopril     States caused gynecomastia in left breast area.     Review of Systems    Objective:   BP (!) 144/98 (BP Location: Left Arm, Patient Position: Sitting, Cuff Size: Normal)   Pulse 88   Ht 6' (1.829 m)   Wt 205 lb (93 kg)   BMI 27.80 kg/m   Physical Exam NAD HEENT:  PERRL, EOMI Neck:  Supple, No adenopathy Chest:  CTA CV:  RRR with normal S1 and S2, No S3, S4 or murmur.  Radial and DP pulses normal and equal Abd:  S, NT, No HSM or mass, + BS.  Reducible umbilical hernia. LE:  No edema.   Assessment & Plan    Hypertension:  Encouraged scheduling his evening better  with a meal, whether small or large so he does not forget to take Losartan.  Does not tolerated both BP meds at same time in morning.  Encouraged improved diet and gradually adding time daily to physical activity.  CBC, CMP  2.  Prediabetes:  A1C  3.  Peripheral neuropathy:  work up with normal CBC, CMP, B12, TSH in August.  Did not run RBC folate, so will send again.  No sodas.  4.  ED:  Cialis 5 mg in a 24 hour period as needed.  Discussed to notify EMS of cialis use if chest pain.  5.  Umbilical hernia:  remains reducible.  6.  HM:  encouraged influenza and COVID boosters still, but next year to obtain in October.  Not interested in Shingrix as well.  Will need pneumococcal vaccine next December.

## 2022-11-21 LAB — CBC WITH DIFFERENTIAL/PLATELET
Basophils Absolute: 0 10*3/uL (ref 0.0–0.2)
Basos: 0 %
EOS (ABSOLUTE): 0.1 10*3/uL (ref 0.0–0.4)
Eos: 1 %
Hematocrit: 48.1 % (ref 37.5–51.0)
Hemoglobin: 15 g/dL (ref 13.0–17.7)
Immature Grans (Abs): 0 10*3/uL (ref 0.0–0.1)
Immature Granulocytes: 0 %
Lymphocytes Absolute: 1.7 10*3/uL (ref 0.7–3.1)
Lymphs: 18 %
MCH: 26.7 pg (ref 26.6–33.0)
MCHC: 31.2 g/dL — ABNORMAL LOW (ref 31.5–35.7)
MCV: 86 fL (ref 79–97)
Monocytes Absolute: 0.6 10*3/uL (ref 0.1–0.9)
Monocytes: 7 %
Neutrophils Absolute: 6.7 10*3/uL (ref 1.4–7.0)
Neutrophils: 74 %
Platelets: 368 10*3/uL (ref 150–450)
RBC: 5.62 x10E6/uL (ref 4.14–5.80)
RDW: 12.8 % (ref 11.6–15.4)
WBC: 9.2 10*3/uL (ref 3.4–10.8)

## 2022-11-21 LAB — COMPREHENSIVE METABOLIC PANEL
ALT: 21 IU/L (ref 0–44)
AST: 19 IU/L (ref 0–40)
Albumin/Globulin Ratio: 1.5 (ref 1.2–2.2)
Albumin: 4.6 g/dL (ref 3.9–4.9)
Alkaline Phosphatase: 69 IU/L (ref 44–121)
BUN/Creatinine Ratio: 8 — ABNORMAL LOW (ref 10–24)
BUN: 8 mg/dL (ref 8–27)
Bilirubin Total: 0.5 mg/dL (ref 0.0–1.2)
CO2: 25 mmol/L (ref 20–29)
Calcium: 9.7 mg/dL (ref 8.6–10.2)
Chloride: 99 mmol/L (ref 96–106)
Creatinine, Ser: 1.04 mg/dL (ref 0.76–1.27)
Globulin, Total: 3 g/dL (ref 1.5–4.5)
Glucose: 103 mg/dL — ABNORMAL HIGH (ref 70–99)
Potassium: 4.4 mmol/L (ref 3.5–5.2)
Sodium: 137 mmol/L (ref 134–144)
Total Protein: 7.6 g/dL (ref 6.0–8.5)
eGFR: 80 mL/min/{1.73_m2} (ref >59)

## 2022-11-21 LAB — LIPID PANEL W/O CHOL/HDL RATIO
Cholesterol, Total: 147 mg/dL (ref 100–199)
HDL: 45 mg/dL (ref >39)
LDL Chol Calc (NIH): 89 mg/dL (ref 0–99)
Triglycerides: 65 mg/dL (ref 0–149)
VLDL Cholesterol Cal: 13 mg/dL (ref 5–40)

## 2022-11-21 LAB — FOLATE RBC
Folate, Hemolysate: 377 ng/mL
Folate, RBC: 784 ng/mL (ref >498)

## 2022-11-21 LAB — HGB A1C W/O EAG: Hgb A1c MFr Bld: 6 % — ABNORMAL HIGH (ref 4.8–5.6)

## 2022-12-05 ENCOUNTER — Encounter (HOSPITAL_BASED_OUTPATIENT_CLINIC_OR_DEPARTMENT_OTHER): Payer: Self-pay | Admitting: Emergency Medicine

## 2022-12-05 ENCOUNTER — Other Ambulatory Visit: Payer: Self-pay

## 2022-12-05 ENCOUNTER — Emergency Department (HOSPITAL_BASED_OUTPATIENT_CLINIC_OR_DEPARTMENT_OTHER)
Admission: EM | Admit: 2022-12-05 | Discharge: 2022-12-05 | Disposition: A | Discharge status: Home / Self Care | Payer: Commercial Managed Care - HMO | Attending: Emergency Medicine | Admitting: Emergency Medicine

## 2022-12-05 DIAGNOSIS — Z79899 Other long term (current) drug therapy: Secondary | ICD-10-CM | POA: Insufficient documentation

## 2022-12-05 DIAGNOSIS — E878 Other disorders of electrolyte and fluid balance, not elsewhere classified: Secondary | ICD-10-CM | POA: Diagnosis not present

## 2022-12-05 DIAGNOSIS — K625 Hemorrhage of anus and rectum: Secondary | ICD-10-CM | POA: Diagnosis present

## 2022-12-05 DIAGNOSIS — E871 Hypo-osmolality and hyponatremia: Secondary | ICD-10-CM | POA: Insufficient documentation

## 2022-12-05 DIAGNOSIS — I10 Essential (primary) hypertension: Secondary | ICD-10-CM | POA: Insufficient documentation

## 2022-12-05 DIAGNOSIS — K648 Other hemorrhoids: Secondary | ICD-10-CM | POA: Diagnosis not present

## 2022-12-05 LAB — CBC
HCT: 48.8 % (ref 39.0–52.0)
Hemoglobin: 15.8 g/dL (ref 13.0–17.0)
MCH: 27.2 pg (ref 26.0–34.0)
MCHC: 32.4 g/dL (ref 30.0–36.0)
MCV: 84 fL (ref 80.0–100.0)
Platelets: 305 10*3/uL (ref 150–400)
RBC: 5.81 MIL/uL (ref 4.22–5.81)
RDW: 14.7 % (ref 11.5–15.5)
WBC: 9.1 10*3/uL (ref 4.0–10.5)
nRBC: 0 % (ref 0.0–0.2)

## 2022-12-05 LAB — COMPREHENSIVE METABOLIC PANEL
ALT: 19 U/L (ref 0–44)
AST: 22 U/L (ref 15–41)
Albumin: 4.8 g/dL (ref 3.5–5.0)
Alkaline Phosphatase: 69 U/L (ref 38–126)
Anion gap: 10 (ref 5–15)
BUN: 12 mg/dL (ref 8–23)
CO2: 26 mmol/L (ref 22–32)
Calcium: 9.1 mg/dL (ref 8.9–10.3)
Chloride: 97 mmol/L — ABNORMAL LOW (ref 98–111)
Creatinine, Ser: 1.08 mg/dL (ref 0.61–1.24)
GFR, Estimated: >60 mL/min (ref >60)
Glucose, Bld: 104 mg/dL — ABNORMAL HIGH (ref 70–99)
Potassium: 3.5 mmol/L (ref 3.5–5.1)
Sodium: 133 mmol/L — ABNORMAL LOW (ref 135–145)
Total Bilirubin: 1 mg/dL (ref 0.3–1.2)
Total Protein: 9.2 g/dL — ABNORMAL HIGH (ref 6.5–8.1)

## 2022-12-05 LAB — OCCULT BLOOD X 1 CARD TO LAB, STOOL: Fecal Occult Bld: NEGATIVE

## 2022-12-05 NOTE — ED Triage Notes (Signed)
Bright red rectal bleeding x 3 , since 5 days ago . Hx hemorrhoids yet never bled this bad he reports

## 2022-12-05 NOTE — ED Provider Notes (Signed)
[facility] Provider Note   CSN: [id number] Arrival date & time: 12/05/22  [id number]     History  Chief Complaint  Patient presents with   Rectal Bleeding    Carl Wright is a 67 y.o. male with a past medical history of hypertension, prediabetes and previously treated hep C presenting today due to rectal bleeding.  He reports that on Wednesday, Thursday and Friday he had bowel movements with bright red blood mixed in.  Says that this has happened in the past but never this severe.  He then did not have a bowel movement on Saturday and Sunday.  On Monday he did have a bowel movement without blood.  Denies alcohol, NSAIDs, IBS or IBD.  Had a colonoscopy 3 months ago that had some polyps that were removed however no other abnormalities.  He tells me he is concerned because he was looking up on the Internet and it said his small intestine might be "ruptured."  Also says that he is feeling overall fatigue.  Complains of numbness and tingling in his hands and feet as well as tenderness.     Rectal Bleeding Associated symptoms: abdominal pain   Associated symptoms: no fever and no vomiting        Home Medications Prior to Admission medications   Medication Sig Start Date End Date Taking? Authorizing Provider  amLODipine (NORVASC) 10 MG tablet 1 tab by mouth daily 11/20/22   Mack Hook, MD  losartan (COZAAR) 50 MG tablet Take 1 tablet (50 mg total) by mouth daily. 05/19/22   Mack Hook, MD  tadalafil (CIALIS) 5 MG tablet Take 1 tablet (5 mg total) by mouth daily. 11/20/22   Mack Hook, MD      Allergies    Lisinopril    Review of Systems   Review of Systems  Constitutional:  Positive for fatigue. Negative for fever.  HENT:  Positive for tinnitus. Negative for ear pain.   Gastrointestinal:  Positive for abdominal pain, blood in stool and hematochezia. Negative for constipation, diarrhea, nausea and vomiting.   Neurological:  Positive for numbness.    Physical Exam Updated Vital Signs BP (!) 144/87 (BP Location: Left Arm)   Pulse 90   Temp 98.7 F (37.1 C) (Oral)   Resp 16   SpO2 100%  Physical Exam Vitals and nursing note reviewed.  Constitutional:      General: He is not in acute distress.    Appearance: Normal appearance. He is not ill-appearing.  HENT:     Head: Normocephalic and atraumatic.     Right Ear: Tympanic membrane normal.     Left Ear: Tympanic membrane normal.  Eyes:     General: No scleral icterus.    Conjunctiva/sclera: Conjunctivae normal.  Pulmonary:     Effort: Pulmonary effort is normal. No respiratory distress.  Abdominal:     General: Abdomen is flat.     Palpations: Abdomen is soft.     Tenderness: There is no abdominal tenderness.  Skin:    General: Skin is warm and dry.     Findings: No rash.  Neurological:     Mental Status: He is alert.  Psychiatric:        Mood and Affect: Mood normal.     ED Results / Procedures / Treatments   Labs (all labs ordered are listed, but only abnormal results are displayed) Labs Reviewed  COMPREHENSIVE METABOLIC PANEL - Abnormal; Notable for the following components:  Result Value   Sodium 133 (*)    Chloride 97 (*)    Glucose, Bld 104 (*)    Total Protein 9.2 (*)    All other components within normal limits  CBC  POC OCCULT BLOOD, ED    EKG None  Radiology No results found.  Procedures Procedures   Medications Ordered in ED Medications - No data to display  ED Course/ Medical Decision Making/ A&P                             Medical Decision Making Amount and/or Complexity of Data Reviewed Labs: ordered.   67 year old male presenting today with rectal bleeding.  Differential includes but is not limited to bleeding hemorrhoids, IBS/IBD, CRC, PUD, diverticular bleed. This is not an exhaustive differential.    Past Medical History / Co-morbidities / Social History: Prediabetic,  hypertension   Additional history: I viewed patient's colonoscopy.  No reports of diverticulosis.  Did report multiple nonbleeding internal hemorrhoids   Per outpatient chart review patient has had multiple visits to internal medicine complaining of numbness and tingling in his hands and feet.  He has had a negative workup for causes of his neuropathy.  Not diabetic, normal B12 and folate.  Is being followed by PCP.  Physical Exam: Pertinent physical exam findings include Tan stool in the rectal vault, no impaction, internal hemorrhoids and 1 1:00 external hemorrhoid  Lab Tests: I ordered, and personally interpreted labs.  The pertinent results include: Stable hemoglobin Sodium 133, chloride 97   Imaging Studies: Considered CT and GI imaging however does not appear to be indicated at this time     Medications: No symptoms requiring medicating  MDM/Disposition: This is a 67 year old male who presented today with rectal bleeding.  Also complained of some tinnitus and finger tingling that started being followed by outpatient PCP.  Hemoglobin was stable.  He had some internal hemorrhoids that I was able to palpate on physical exam and I reviewed his colonoscopy that had more internal hemorrhoids.  Potentially the source of his bleeding.  Hemoccult negative.  Lab work relatively benign.  Mildly decreased sodium and chloride.  Do not have a solid answer for his finger and feet numbness however he has been diagnosed with neuropathy and is being followed by his PCP.  This is an appropriate route of treatment.  He will be referred to an ENT physician for intermittent tinnitus.  Does not need further workup with MRI or other imaging today.  He is agreeable to discharge home at this time.  Final Clinical Impression(s) / ED Diagnoses Final diagnoses:  Internal hemorrhoid, bleeding  Hyponatremia  Hypochloremia    Rx / DC Orders ED Discharge Orders     None      Results and diagnoses were  explained to the patient. Return precautions discussed in full. Patient had no additional questions and expressed complete understanding.   This chart was dictated using voice recognition software.  Despite best efforts to proofread,  errors can occur which can change the documentation meaning.    Darliss Ridgel 12/05/22 1136    Gareth Morgan, MD 12/05/22 2324

## 2022-12-05 NOTE — Discharge Instructions (Addendum)
You came to the emergency department today with tingling to your hands, ringing in your ears and blood in your stool.  Your labs do not show any need for blood transfusion and I suspect your rectal bleeding to be secondary to your internal hemorrhoids.  I recommend taking stool softeners and increasing the fiber in your diet.  Your sodium and chloride were slightly low.  Add some electrolyte drinks to your diet.  Try for sugar-free versions of these due to your prediabetes.  Your tingling and slight ear ringing is something you should continue to follow with your primary care about.  I have also given you an office for an ear nose and throat doctor who has the tools to look closer at your ears.  Do not hesitate to return to an emergency department with any worsening or recurring symptoms.  Otherwise, please call your doctor today and inform them of your hospital visit.  It was a pleasure to meet you and we hope you feel better

## 2022-12-20 ENCOUNTER — Ambulatory Visit (INDEPENDENT_AMBULATORY_CARE_PROVIDER_SITE_OTHER): Payer: Commercial Managed Care - HMO

## 2022-12-20 VITALS — BP 138/78 | HR 88

## 2022-12-20 DIAGNOSIS — Z0130 Encounter for examination of blood pressure without abnormal findings: Secondary | ICD-10-CM | POA: Diagnosis not present

## 2022-12-20 NOTE — Progress Notes (Signed)
Patient reported that he is taking all bp medication consistently.  After reporting bp to Dr Amil Amen, not changes to medication will be made.

## 2023-05-21 ENCOUNTER — Encounter: Payer: Self-pay | Admitting: Internal Medicine

## 2023-05-21 ENCOUNTER — Ambulatory Visit: Payer: Self-pay | Admitting: Internal Medicine

## 2023-05-21 VITALS — BP 152/84 | HR 94 | Resp 16 | Ht 72.0 in | Wt 208.0 lb

## 2023-05-21 DIAGNOSIS — G8929 Other chronic pain: Secondary | ICD-10-CM

## 2023-05-21 DIAGNOSIS — R7303 Prediabetes: Secondary | ICD-10-CM

## 2023-05-21 DIAGNOSIS — I10 Essential (primary) hypertension: Secondary | ICD-10-CM

## 2023-05-21 DIAGNOSIS — Z0000 Encounter for general adult medical examination without abnormal findings: Secondary | ICD-10-CM

## 2023-05-21 DIAGNOSIS — N529 Male erectile dysfunction, unspecified: Secondary | ICD-10-CM

## 2023-05-21 DIAGNOSIS — M545 Low back pain, unspecified: Secondary | ICD-10-CM

## 2023-05-21 MED ORDER — TADALAFIL 5 MG PO TABS: ORAL_TABLET | ORAL | 6 refills | Status: DC

## 2023-05-21 MED ORDER — LOSARTAN POTASSIUM 100 MG PO TABS: ORAL_TABLET | ORAL | 11 refills | Status: DC

## 2023-05-21 MED ORDER — SILDENAFIL CITRATE 50 MG PO TABS: ORAL_TABLET | ORAL | 6 refills | Status: DC

## 2023-05-21 MED ORDER — TRIAMCINOLONE ACETONIDE 0.1 % EX CREA: TOPICAL_CREAM | CUTANEOUS | 3 refills | Status: AC

## 2023-05-21 NOTE — Progress Notes (Addendum)
Subjective:    Patient ID: Carl Wright, male   DOB: December 22, 1955, 67 y.o.   MRN: [id number]   HPI  Here for Male CPE:  1.  STE:  Does perform.    2.  PSA:  Last checked 04/2022 and remains in normal range at 1.0.  No family history of prostate cancer.    3.  Guaiac Cards/FIT:  Negative in August 2023 and 11/2022.    4.  Colonoscopy:  Performed 08/2022 with Dr. Orvan Falconer, who is no longer with [facility].  6 adenomatous polyps without dysplasia.  Repeat planned for 08/2025.    5.  Cholesterol/Glucose:  Last cholesterol panel was fine 10/2022.  A1C in January improved slightly to 6.0%  He carries diagnosis of prediabetes. Lipid Panel     Component Value Date/Time   CHOL 147 11/20/2022 0926   TRIG 65 11/20/2022 0926   HDL 45 11/20/2022 0926   LDLCALC 89 11/20/2022 0926   LABVLDL 13 11/20/2022 0926     6.  Immunizations: He is not interested Shingles.  He cannot say whether he obtained COVID or influenza vaccines as recommended in January.  Immunization History  Administered Date(s) Administered   Influenza-Unspecified 08/08/2021   Moderna Covid-19 Vaccine Bivalent Booster 14yrs & up 05/19/2022   Moderna Sars-Covid-2 Vaccination 03/15/2020, 04/12/2020   Tdap 10/23/2013     Current Meds  Medication Sig   amLODipine (NORVASC) 10 MG tablet 1 tab by mouth daily   losartan (COZAAR) 50 MG tablet Take 1 tablet (50 mg total) by mouth daily.   tadalafil (CIALIS) 5 MG tablet Take 1 tablet (5 mg total) by mouth daily.   Allergies  Allergen Reactions   Lisinopril     States caused gynecomastia in left breast area.     Review of Systems  Eyes:  Positive for visual disturbance (Occasionally with blurry vision.).  Respiratory:  Negative for shortness of breath.   Cardiovascular:  Positive for leg swelling (rare--only when on feet all day.). Negative for chest pain and palpitations.  Gastrointestinal:  Negative for abdominal pain and blood in stool.  Genitourinary:        ED:  Feels  Cialis does not last for him. He actually has no issues with masturbation--does not even require cialis, but is anxious he cannot perform when trying to have intercourse with wife   Musculoskeletal:  Positive for back pain (Right lower back pain:  awakened one morning with the pain.  Cannot recall any acute injury or activity prior.  He does lie in his bed a lot watching TV.  No issues with urination or dysuria, frequency.  Notes most with yard work where bending forward.).  Skin:        Dry itchy patch at nape of neck--has had since 2017.  Has used creams.    Neurological:  Negative for weakness (No associated numbness, tingling or weakness down legs with right low back pain.) and numbness (Tingling he used to have in feet resolved after discontinuing sodas.).      Objective:   BP (!) 152/84 (BP Location: Left Arm, Patient Position: Sitting, Cuff Size: Normal)   Pulse 94   Resp 16   Ht 6' (1.829 m)   Wt 208 lb (94.3 kg)   BMI 28.21 kg/m   Physical Exam HENT:     Head: Normocephalic and atraumatic.     Right Ear: Tympanic membrane, ear canal and external ear normal.     Left Ear: There is impacted cerumen (Dry--states  he used Qtip to clean today.).     Mouth/Throat:     Mouth: Mucous membranes are moist.     Pharynx: Oropharynx is clear.  Eyes:     Extraocular Movements: Extraocular movements intact.     Conjunctiva/sclera: Conjunctivae normal.     Pupils: Pupils are equal, round, and reactive to light.     Comments: Arcus cialis. Discs sharp  Neck:     Thyroid: No thyroid mass or thyromegaly.  Cardiovascular:     Rate and Rhythm: Normal rate and regular rhythm.     Heart sounds: S1 normal and S2 normal. No murmur heard.    No friction rub. No S3 or S4 sounds.     Comments: No carotid bruits.  Carotid, radial, femoral, DP and PT pulses normal and equal.   Pulmonary:     Effort: Pulmonary effort is normal.     Breath sounds: Normal breath sounds and air entry.  Abdominal:      General: Bowel sounds are normal.     Palpations: Abdomen is soft. There is no hepatomegaly, splenomegaly or mass.     Tenderness: There is no abdominal tenderness.     Hernia: A hernia (Umbilical, easily reducible.) is present. There is no hernia in the left inguinal area or right inguinal area.  Genitourinary:    Penis: Normal and uncircumcised.      Testes:        Right: Mass or tenderness not present. Right testis is descended.        Left: Mass or tenderness not present. Left testis is descended.  Musculoskeletal:        General: Normal range of motion.     Cervical back: Normal range of motion and neck supple.     Right lower leg: No edema.     Left lower leg: No edema.  Lymphadenopathy:     Head:     Right side of head: No submental or submandibular adenopathy.     Left side of head: No submental or submandibular adenopathy.     Cervical: No cervical adenopathy.     Upper Body:     Right upper body: No supraclavicular adenopathy.     Left upper body: No supraclavicular adenopathy.     Lower Body: No right inguinal adenopathy. No left inguinal adenopathy.  Skin:    General: Skin is warm.     Capillary Refill: Capillary refill takes less than 2 seconds.          Comments: Dry, lichenified skin across upper back.  Neurological:     General: No focal deficit present.     Mental Status: He is alert and oriented to person, place, and time.     Cranial Nerves: Cranial nerves 2-12 are intact.     Sensory: Sensation is intact.     Motor: Motor function is intact.     Coordination: Coordination is intact.     Gait: Gait is intact.     Deep Tendon Reflexes: Reflexes are normal and symmetric.  Psychiatric:        Speech: Speech normal.        Behavior: Behavior normal. Behavior is cooperative.      Assessment & Plan   CPE Encouraged Influenza, COVID and Shingles vaccinations.  Discussed pneumococcal on or after birthday.  He is skeptical regarding obtaining any of  these.  PSA next year with CPE  2.  Hypertension:  not at goal.  Increase Losartan to 100 mg with  follow up in 1 month with BMP  3.  ED:  Seems more of a performance anxiety issue.  Encouraged him to speak with his wife about ways to decrease his concerns.  He is without insurance now, so will switch to Sildenafil, which is less expensive.    4.  Prediabetes:  A1C today.  encouraged continuing to work on diet and physical activity.    5.  Right low back pain:  PT referral--[facility]   6.  Dry skin upper back: avoid hot shower and water hitting this area.  Triamcinolone cream twice daily to area with Eucerin cream overlying each time.  Dove soap

## 2023-05-21 NOTE — Addendum Note (Signed)
Addended by: Marcene Duos on: 05/21/2023 10:03 AM   Modules accepted: Orders

## 2023-06-18 ENCOUNTER — Other Ambulatory Visit: Payer: Self-pay

## 2023-06-18 VITALS — BP 130/80 | HR 100

## 2023-06-18 DIAGNOSIS — Z0130 Encounter for examination of blood pressure without abnormal findings: Secondary | ICD-10-CM

## 2023-06-18 DIAGNOSIS — Z79899 Other long term (current) drug therapy: Secondary | ICD-10-CM

## 2023-06-18 NOTE — Progress Notes (Unsigned)
Patient reports that he has been taking bp medication consistently.   Medication will remain the same.

## 2023-06-19 LAB — BASIC METABOLIC PANEL
BUN/Creatinine Ratio: 10 (ref 10–24)
BUN: 12 mg/dL (ref 8–27)
CO2: 22 mmol/L (ref 20–29)
Calcium: 9.7 mg/dL (ref 8.6–10.2)
Chloride: 101 mmol/L (ref 96–106)
Creatinine, Ser: 1.15 mg/dL (ref 0.76–1.27)
Glucose: 102 mg/dL — ABNORMAL HIGH (ref 70–99)
Potassium: 4.2 mmol/L (ref 3.5–5.2)
Sodium: 141 mmol/L (ref 134–144)
eGFR: 71 mL/min/{1.73_m2} (ref >59)

## 2023-11-23 ENCOUNTER — Other Ambulatory Visit (INDEPENDENT_AMBULATORY_CARE_PROVIDER_SITE_OTHER): Payer: Medicare HMO

## 2023-11-23 DIAGNOSIS — R7303 Prediabetes: Secondary | ICD-10-CM

## 2023-11-24 LAB — HEMOGLOBIN A1C
Est. average glucose Bld gHb Est-mCnc: 128 mg/dL
Hgb A1c MFr Bld: 6.1 % — ABNORMAL HIGH (ref 4.8–5.6)

## 2023-11-27 ENCOUNTER — Ambulatory Visit: Payer: Medicare HMO | Admitting: Internal Medicine

## 2024-01-04 ENCOUNTER — Other Ambulatory Visit: Payer: Self-pay

## 2024-01-04 ENCOUNTER — Other Ambulatory Visit: Payer: Self-pay | Admitting: Internal Medicine

## 2024-03-18 ENCOUNTER — Ambulatory Visit: Payer: Medicare HMO | Admitting: Internal Medicine

## 2024-03-19 ENCOUNTER — Other Ambulatory Visit

## 2024-03-19 DIAGNOSIS — R7303 Prediabetes: Secondary | ICD-10-CM | POA: Diagnosis not present

## 2024-03-20 ENCOUNTER — Encounter: Payer: Self-pay | Admitting: Internal Medicine

## 2024-03-20 ENCOUNTER — Ambulatory Visit: Admitting: Internal Medicine

## 2024-03-20 VITALS — BP 120/88 | HR 103 | Resp 18 | Ht 72.0 in | Wt 204.2 lb

## 2024-03-20 DIAGNOSIS — R7303 Prediabetes: Secondary | ICD-10-CM

## 2024-03-20 DIAGNOSIS — I10 Essential (primary) hypertension: Secondary | ICD-10-CM

## 2024-03-20 LAB — HEMOGLOBIN A1C
Est. average glucose Bld gHb Est-mCnc: 114 mg/dL
Hgb A1c MFr Bld: 5.6 % (ref 4.8–5.6)

## 2024-03-20 NOTE — Progress Notes (Signed)
    Subjective:    Patient ID: Carl Wright, male   DOB: October 17, 1956, 68 y.o.   MRN: [id number]   HPI   Prediabetes:  A1C now down to normal range at 5.6%.    2.  Hypertension:  Tolerating bp meds fine.    3.  HM:  does not want Tdap today.  He suffered from Influenza A for 2 weeks a couple of months ago.  Encouraged influenza and COVID vaccines in the fall.    Current Meds  Medication Sig   amLODipine  (NORVASC ) 10 MG tablet Take 1 tablet by mouth once daily   losartan  (COZAAR ) 100 MG tablet 1 tab by mouth daily   sildenafil  (VIAGRA ) 50 MG tablet 1 to 2 tabs by mouth 1/2 hour before activity once in 24 hour period.   triamcinolone  cream (KENALOG ) 0.1 % Apply to affected area twice daily as needed.   Allergies  Allergen Reactions   Lisinopril     States caused gynecomastia in left breast area.     Review of Systems    Objective:   BP 120/88 (BP Location: Left Arm, Patient Position: Sitting, Cuff Size: Normal)   Pulse (!) 103   Resp 18   Ht 6' (1.829 m)   Wt 204 lb 4 oz (92.6 kg)   SpO2 97%   BMI 27.70 kg/m   Physical Exam NAD HEENT;  PERRL, EOMI Neck:  supple, No adenopathy Chest:  CTA CV: RRR without murmur or rub.  Radial and DP pulses normal and equal. LE:  No edema.     Assessment & Plan   Prediabetes:  A1C now in normal range.  2.  Hypertension:  controlled.    3.  HM:  not interested in Vaccines today.   He describes a prolonged illness with influenza recently, but still not convinced he should obtain vaccination.

## 2024-05-20 ENCOUNTER — Ambulatory Visit: Payer: Medicare HMO | Admitting: Internal Medicine

## 2024-05-20 ENCOUNTER — Encounter: Payer: Self-pay | Admitting: Internal Medicine

## 2024-05-20 VITALS — BP 138/80 | HR 80 | Resp 18 | Ht 72.0 in | Wt 205.0 lb

## 2024-05-20 DIAGNOSIS — Z0000 Encounter for general adult medical examination without abnormal findings: Secondary | ICD-10-CM

## 2024-05-20 DIAGNOSIS — R7303 Prediabetes: Secondary | ICD-10-CM | POA: Diagnosis not present

## 2024-05-20 DIAGNOSIS — I10 Essential (primary) hypertension: Secondary | ICD-10-CM | POA: Diagnosis not present

## 2024-05-20 DIAGNOSIS — Z23 Encounter for immunization: Secondary | ICD-10-CM

## 2024-05-20 NOTE — Progress Notes (Unsigned)
 Subjective:    Patient ID: Carl Wright, male   DOB: 1956-06-01, 68 y.o.   MRN: [id number]   HPI  Here for Male CPE:  1.  STE:  Does not perform.  No family history of testicular cancer.    2.  PSA:  Last in 2023 and normal at 1.0.  No family history of prostate cancer.    3.  Guaiac Cards/FIT:  Last checked 11/2022 and negative for blood.    4.  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 Eda, GI, who is no longer in area.  He is due next year 08/2025 for repeat.    5.  Cholesterol/Glucose:  Has not had fasting labs done this year.  Cholesterol fine in 2024 and A1C in May of this year was normal at 5.6%.  History of prediabetes with A1C of 6.0%.   Lipid Panel     Component Value Date/Time   CHOL 147 11/20/2022 0926   TRIG 65 11/20/2022 0926   HDL 45 11/20/2022 0926   LDLCALC 89 11/20/2022 0926   LABVLDL 13 11/20/2022 0926     6.  Immunizations: Has not had a pneumococcal vaccine.  Due for tetanus vaccination today.  Immunization History  Administered Date(s) Administered   Influenza-Unspecified 08/08/2021   Moderna Covid-19 Vaccine  Bivalent Booster 69yrs & up 05/19/2022   Moderna Sars-Covid-2 Vaccination 03/15/2020, 04/12/2020   Tdap 10/23/2013     Current Meds  Medication Sig   amLODipine  (NORVASC ) 10 MG tablet Take 1 tablet by mouth once daily   losartan  (COZAAR ) 100 MG tablet 1 tab by mouth daily   sildenafil  (VIAGRA ) 50 MG tablet 1 to 2 tabs by mouth 1/2 hour before activity once in 24 hour period.   triamcinolone  cream (KENALOG ) 0.1 % Apply to affected area twice daily as needed.   Allergies  Allergen Reactions   Lisinopril     States caused gynecomastia in left breast area.   Past Medical History:  Diagnosis Date   History of hepatitis C 2018   Treated with oral therapy for 84 days and viral titre reportedly negative thereafter.  In prison   Hypertension 2010   on meds   Pre-diabetes    diet controlled   Prediabetes 2017    weighed 25 lbs more then   Umbilical hernia 12/02/2018   Past Surgical History:  Procedure Laterality Date   LIVER BIOPSY  2007   Family History  Problem Relation Age of Onset   Pneumonia Mother    Heart disease Father 29       MI was cause of death   Stroke Father    Hypertension Father    Diabetes Father    COPD Sister    Hypertension Sister    Diabetes Sister    Colon polyps Neg Hx    Colon cancer Neg Hx    Esophageal cancer Neg Hx    Stomach cancer Neg Hx    Rectal cancer Neg Hx    Social History   Socioeconomic History   Marital status: Married    Spouse name: Titan Karner   Number of children: 1   Years of education: 12   Highest education level: High school graduate  Occupational History   Occupation: unemployed    Comment: Previously had his own Set designer company   Occupation: retired 2023  Tobacco Use   Smoking status: Former    Current packs/day: 0.00    Average packs/day: 0.5 packs/day for 20.0 years (  10.0 ttl pk-yrs)    Types: Cigarettes    Start date: 10/23/1973    Quit date: 10/23/1993    Years since quitting: 30.6    Passive exposure: Past   Smokeless tobacco: Never  Vaping Use   Vaping status: Never Used  Substance and Sexual Activity   Alcohol use: Not Currently    Comment: History of 3 DUIs; last was 2015.     Drug use: Yes    Types: Marijuana    Comment: Hx in 1980s.  Smokes MJ daily only.   Sexual activity: Yes    Birth control/protection: Post-menopausal  Other Topics Concern   Not on file  Social History Narrative   Released from prison for food stamp fraud--5 year sentence for which he was released Jan 2020.    His wife is originally [place] and caught up in same situation.  ICE has detained her for the past 5 years.  He thought she was to be released to him when he got out of prison--so he has this stress as well. (Old history:  [place], his wife has been out for some time (2023 documentation)   Her green card expired 2 weeks before she was  arrested.   Living with his wife, stepdaughter and 4 grandchildren--very dysfunctional extended family.   3/4 [place] Native American   Also [place] and [place] American      Social Drivers of Health   Financial Resource Strain: Low Risk  (05/20/2024)   Overall Financial Resource Strain (CARDIA)    Difficulty of Paying Living Expenses: Not hard at all  Food Insecurity: No Food Insecurity (05/20/2024)   Hunger Vital Sign    Worried About Running Out of Food in the Last Year: Never true    Ran Out of Food in the Last Year: Never true  Transportation Needs: No Transportation Needs (05/20/2024)   PRAPARE - Administrator, Civil Service (Medical): No    Lack of Transportation (Non-Medical): No  Physical Activity: Sufficiently Active (12/02/2018)   Exercise Vital Sign    Days of Exercise per Week: 7 days    Minutes of Exercise per Session: 30 min  Stress: Stress Concern Present (12/02/2018)   [employer] of Occupational Health - Occupational Stress Questionnaire    Feeling of Stress : Very much  Social Connections: Not on file  Intimate Partner Violence: Not At Risk (05/20/2024)   Humiliation, Afraid, Rape, and Kick questionnaire    Fear of Current or Ex-Partner: No    Emotionally Abused: No    Physically Abused: No    Sexually Abused: No     Review of Systems  HENT:  Negative for dental problem (Does not go to dentist).   Eyes:  Negative for visual disturbance (Reading glasses.).  Respiratory:  Negative for shortness of breath.   Cardiovascular:  Positive for leg swelling (occasionally when on feet a lot.). Negative for chest pain and palpitations.  Gastrointestinal:  Negative for abdominal pain and blood in stool (No melena.).  Genitourinary:  Negative for decreased urine volume and dysuria.  Skin:        Black spot on upper back/neck  Psychiatric/Behavioral:  Negative for dysphoric mood. The patient is not nervous/anxious.       Objective:   BP 138/80 (BP  Location: Left Arm)   Pulse 80   Resp 18   Ht 6' (1.829 m)   Wt 205 lb (93 kg)   BMI 27.80 kg/m   Physical Exam HENT:  Head: Normocephalic and atraumatic.     Right Ear: Tympanic membrane, ear canal and external ear normal.     Left Ear: Tympanic membrane, ear canal and external ear normal.     Nose: Nose normal.     Mouth/Throat:     Mouth: Mucous membranes are moist.     Pharynx: Oropharynx is clear.  Eyes:     Extraocular Movements: Extraocular movements intact.     Conjunctiva/sclera: Conjunctivae normal.     Pupils: Pupils are equal, round, and reactive to light.     Comments: Discs sharp  Neck:     Thyroid: No thyroid mass or thyromegaly.  Cardiovascular:     Rate and Rhythm: Normal rate and regular rhythm.     Heart sounds: S1 normal and S2 normal. No murmur heard.    No friction rub. No S3 or S4 sounds.     Comments: No carotid bruits.  Carotid, radial, femoral, DP and PT pulses normal and equal.   Pulmonary:     Effort: Pulmonary effort is normal.     Breath sounds: Normal breath sounds and air entry.  Abdominal:     General: Bowel sounds are normal.     Palpations: Abdomen is soft. There is no hepatomegaly, splenomegaly or mass.     Tenderness: There is no abdominal tenderness.     Hernia: A hernia is present. Hernia is present in the umbilical area (small, reducible). There is no hernia in the left inguinal area or right inguinal area.  Genitourinary:    Penis: Normal.      Testes:        Right: Mass or swelling not present. Right testis is descended.        Left: Mass or swelling not present. Left testis is descended.  Musculoskeletal:        General: Normal range of motion.     Cervical back: Normal range of motion and neck supple.     Right lower leg: No edema.     Left lower leg: No edema.  Lymphadenopathy:     Head:     Right side of head: No submental or submandibular adenopathy.     Left side of head: No submental or submandibular adenopathy.      Cervical: No cervical adenopathy.     Upper Body:     Right upper body: No supraclavicular or axillary adenopathy.     Left upper body: No supraclavicular or axillary adenopathy.     Lower Body: No right inguinal adenopathy. No left inguinal adenopathy.  Skin:    General: Skin is warm.     Capillary Refill: Capillary refill takes less than 2 seconds.         Comments: Patch of slightly grey, ichthyotic skin  Neurological:     General: No focal deficit present.     Mental Status: He is alert and oriented to person, place, and time.     Cranial Nerves: Cranial nerves 2-12 are intact.     Sensory: Sensation is intact.     Motor: Motor function is intact.     Coordination: Coordination is intact.     Gait: Gait is intact.     Deep Tendon Reflexes: Reflexes are normal and symmetric.  Psychiatric:        Speech: Speech normal.        Behavior: Behavior normal. Behavior is cooperative.      Assessment & Plan   CPE Td today Declined Pneumococcal 20 Return  in next 2 weeks for fasting labs, including PSA  2. Hypertension:  controlled  3.  Umbilical hernia:  easily reducible. Continue to follow.  4.  Prediabetes:  labs as above

## 2024-05-30 ENCOUNTER — Other Ambulatory Visit (INDEPENDENT_AMBULATORY_CARE_PROVIDER_SITE_OTHER)

## 2024-05-30 DIAGNOSIS — Z79899 Other long term (current) drug therapy: Secondary | ICD-10-CM

## 2024-05-30 DIAGNOSIS — Z0000 Encounter for general adult medical examination without abnormal findings: Secondary | ICD-10-CM | POA: Diagnosis not present

## 2024-05-31 LAB — LIPID PANEL W/O CHOL/HDL RATIO
Cholesterol, Total: 151 mg/dL (ref 100–199)
HDL: 45 mg/dL (ref >39)
LDL Chol Calc (NIH): 92 mg/dL (ref 0–99)
Triglycerides: 74 mg/dL (ref 0–149)
VLDL Cholesterol Cal: 14 mg/dL (ref 5–40)

## 2024-05-31 LAB — HEMOGLOBIN A1C
Est. average glucose Bld gHb Est-mCnc: 123 mg/dL
Hgb A1c MFr Bld: 5.9 % — ABNORMAL HIGH (ref 4.8–5.6)

## 2024-05-31 LAB — COMPREHENSIVE METABOLIC PANEL WITH GFR
ALT: 13 IU/L (ref 0–44)
AST: 21 IU/L (ref 0–40)
Albumin: 4.6 g/dL (ref 3.9–4.9)
Alkaline Phosphatase: 81 IU/L (ref 44–121)
BUN/Creatinine Ratio: 13 (ref 10–24)
BUN: 12 mg/dL (ref 8–27)
Bilirubin Total: 0.6 mg/dL (ref 0.0–1.2)
CO2: 22 mmol/L (ref 20–29)
Calcium: 9.4 mg/dL (ref 8.6–10.2)
Chloride: 102 mmol/L (ref 96–106)
Creatinine, Ser: 0.93 mg/dL (ref 0.76–1.27)
Globulin, Total: 2.8 g/dL (ref 1.5–4.5)
Glucose: 89 mg/dL (ref 70–99)
Potassium: 4 mmol/L (ref 3.5–5.2)
Sodium: 139 mmol/L (ref 134–144)
Total Protein: 7.4 g/dL (ref 6.0–8.5)
eGFR: 91 mL/min/1.73 (ref >59)

## 2024-05-31 LAB — CBC WITH DIFFERENTIAL/PLATELET
Basophils Absolute: 0 x10E3/uL (ref 0.0–0.2)
Basos: 0 %
EOS (ABSOLUTE): 0.1 x10E3/uL (ref 0.0–0.4)
Eos: 1 %
Hematocrit: 45.5 % (ref 37.5–51.0)
Hemoglobin: 14.5 g/dL (ref 13.0–17.7)
Immature Grans (Abs): 0 x10E3/uL (ref 0.0–0.1)
Immature Granulocytes: 0 %
Lymphocytes Absolute: 1.7 x10E3/uL (ref 0.7–3.1)
Lymphs: 22 %
MCH: 28.3 pg (ref 26.6–33.0)
MCHC: 31.9 g/dL (ref 31.5–35.7)
MCV: 89 fL (ref 79–97)
Monocytes Absolute: 0.6 x10E3/uL (ref 0.1–0.9)
Monocytes: 8 %
Neutrophils Absolute: 5.3 x10E3/uL (ref 1.4–7.0)
Neutrophils: 69 %
Platelets: 269 x10E3/uL (ref 150–450)
RBC: 5.12 x10E6/uL (ref 4.14–5.80)
RDW: 12.1 % (ref 11.6–15.4)
WBC: 7.7 x10E3/uL (ref 3.4–10.8)

## 2024-05-31 LAB — PSA: Prostate Specific Ag, Serum: 0.8 ng/mL (ref 0.0–4.0)

## 2024-06-03 ENCOUNTER — Ambulatory Visit: Payer: Self-pay | Admitting: Internal Medicine

## 2024-06-06 ENCOUNTER — Other Ambulatory Visit

## 2024-06-10 ENCOUNTER — Other Ambulatory Visit: Payer: Self-pay | Admitting: Internal Medicine

## 2024-06-10 ENCOUNTER — Other Ambulatory Visit: Payer: Self-pay

## 2024-06-10 MED ORDER — SILDENAFIL CITRATE 50 MG PO TABS: ORAL_TABLET | ORAL | 6 refills | Status: AC

## 2024-06-11 ENCOUNTER — Other Ambulatory Visit: Payer: Self-pay | Admitting: Internal Medicine

## 2024-06-11 ENCOUNTER — Other Ambulatory Visit: Payer: Self-pay

## 2024-06-11 MED ORDER — LOSARTAN POTASSIUM 100 MG PO TABS: ORAL_TABLET | ORAL | 11 refills | Status: DC

## 2024-06-20 ENCOUNTER — Other Ambulatory Visit: Payer: Self-pay

## 2024-06-20 MED ORDER — AMLODIPINE BESYLATE 10 MG PO TABS: 10.0000 mg | ORAL_TABLET | Freq: Every day | ORAL | 1 refills | Status: DC

## 2024-11-24 ENCOUNTER — Ambulatory Visit: Admitting: Internal Medicine

## 2024-11-27 ENCOUNTER — Ambulatory Visit: Admitting: Internal Medicine

## 2024-11-27 ENCOUNTER — Encounter: Payer: Self-pay | Admitting: Internal Medicine

## 2024-11-27 VITALS — BP 150/90 | HR 80 | Resp 14 | Ht 72.0 in | Wt 202.0 lb

## 2024-11-27 DIAGNOSIS — Z0000 Encounter for general adult medical examination without abnormal findings: Secondary | ICD-10-CM

## 2024-11-27 DIAGNOSIS — F419 Anxiety disorder, unspecified: Secondary | ICD-10-CM

## 2024-11-27 DIAGNOSIS — R7303 Prediabetes: Secondary | ICD-10-CM

## 2024-11-27 DIAGNOSIS — I10 Essential (primary) hypertension: Secondary | ICD-10-CM

## 2024-11-27 DIAGNOSIS — R0789 Other chest pain: Secondary | ICD-10-CM

## 2024-11-27 MED ORDER — CLONAZEPAM 1 MG PO TABS: ORAL_TABLET | ORAL | 1 refills | Status: AC

## 2024-11-27 NOTE — Patient Instructions (Addendum)
 Encourage you to obtain influenza, COVID, Pneumococcal, and Shingrix vaccines.

## 2024-11-27 NOTE — Progress Notes (Unsigned)
" ° ° ° ° °  Subjective:    Patient ID: Carl Wright, male   DOB: [birth year], 69 y.o.   MRN: [id number]   HPI  Here for Male CPE:  1.  STE:  does not perform.  No family history of testicular cancer.    2.  PSA:  last 05/2024 and normal at 0.8.    3.  Guaiac Cards/FIT:  Last 11/2022 and negative for blood.  4.  Colonoscopy:  6 polyps with Dr. Eda, GI on 09/08/2022.  All tubular adenomas without high grade dysplasia.  Due this November for repeat colonoscopy.  Encouraged him to follow through.  He currently is not sure he will go.    5.  Cholesterol/Glucose:  Cholesterol fine in August.  A1C back up to prediabetic range.  In August Lipid Panel     Component Value Date/Time   CHOL 151 05/30/2024 1050   TRIG 74 05/30/2024 1050   HDL 45 05/30/2024 1050   LDLCALC 92 05/30/2024 1050   LABVLDL 14 05/30/2024 1050     6.  Immunizations: Has not had influenza, COVID, Shingles, pneumococcal vaccines.  He declined all.   Immunization History  Administered Date(s) Administered   Influenza-Unspecified 08/08/2021   Moderna Covid-19 Vaccine  Bivalent Booster 63yrs & up 05/19/2022   Moderna Sars-Covid-2 Vaccination 03/15/2020, 04/12/2020   Td 05/20/2024   Tdap 10/23/2013     Current Meds  Medication Sig   amLODipine  (NORVASC ) 10 MG tablet Take 1 tablet by mouth once daily   losartan  (COZAAR ) 100 MG tablet Take 1 tablet by mouth once daily   sildenafil  (VIAGRA ) 50 MG tablet 1 to 2 tabs by mouth 1/2 hour before activity once in 24 hour period.   triamcinolone  cream (KENALOG ) 0.1 % Apply to affected area twice daily as needed.   Allergies  Allergen Reactions   Lisinopril     States caused gynecomastia in left breast area.    ROS   Since mid-December, gets an adrenaline rush  in his chest, like he has been running.  Heart seems to be beating fast, maybe with some pressure in low sternal area and radiates up to midsternum.   No other radiation to neck, jaw or back. No presyncope or  syncope with this. No associated dyspnea.  Tingling in temple areas of head.  Episodes occur daily and are lasting longer--usually just last 10-15 minutes with breathing exercises, but has lasted as long as an hour.   Breathing exercises help. He is physically active throughout day and has not had these symptoms then.  Generally at rest or trying to get somewhere quickly. He is anxious today as he was trying to do too much at one time before coming in.          Has increased stress in life--concerns for wife with political climate. Grandkids do not follow house rules   GI:  no abdominal pain, blood in stool or melena. Neuro:  Feet rarely with tingling.      Objective:   BP (!) 150/90 (BP Location: Left Arm, Patient Position: Sitting)   Pulse 80   Resp 14   Ht 6' (1.829 m)   Wt 202 lb (91.6 kg)   BMI 27.40 kg/m   Physical Exam   Assessment & Plan   "

## 2024-11-28 ENCOUNTER — Other Ambulatory Visit

## 2024-11-28 VITALS — BP 132/76 | HR 92

## 2024-11-28 DIAGNOSIS — Z0130 Encounter for examination of blood pressure without abnormal findings: Secondary | ICD-10-CM

## 2024-11-28 DIAGNOSIS — F419 Anxiety disorder, unspecified: Secondary | ICD-10-CM

## 2024-11-28 NOTE — Progress Notes (Unsigned)
 Patient reports that he is taking amlodipine  10 mg dailt and losartan  100 mg daily. Patient did take medication this morning.

## 2025-05-22 ENCOUNTER — Other Ambulatory Visit

## 2025-05-25 ENCOUNTER — Encounter: Admitting: Internal Medicine

## 2025-05-27 ENCOUNTER — Ambulatory Visit: Admitting: Internal Medicine

## 2025-11-27 ENCOUNTER — Other Ambulatory Visit: Admitting: Internal Medicine

## 2025-12-02 ENCOUNTER — Encounter: Admitting: Internal Medicine
